# Patient Record
Sex: Male | Born: 1935 | Race: White | Hispanic: No | Smoking: Former smoker
Health system: Southern US, Community
[De-identification: ages and names within clinical notes are randomized; demographics above are authoritative.]

## PROBLEM LIST (undated history)

## (undated) DIAGNOSIS — C689 Malignant neoplasm of urinary organ, unspecified: Secondary | ICD-10-CM

## (undated) DIAGNOSIS — I6523 Occlusion and stenosis of bilateral carotid arteries: Secondary | ICD-10-CM

## (undated) DIAGNOSIS — I509 Heart failure, unspecified: Secondary | ICD-10-CM

## (undated) DIAGNOSIS — I350 Nonrheumatic aortic (valve) stenosis: Secondary | ICD-10-CM

## (undated) DIAGNOSIS — Z8546 Personal history of malignant neoplasm of prostate: Secondary | ICD-10-CM

## (undated) DIAGNOSIS — K5730 Diverticulosis of large intestine without perforation or abscess without bleeding: Secondary | ICD-10-CM

## (undated) DIAGNOSIS — Z86711 Personal history of pulmonary embolism: Secondary | ICD-10-CM

## (undated) DIAGNOSIS — J449 Chronic obstructive pulmonary disease, unspecified: Secondary | ICD-10-CM

## (undated) DIAGNOSIS — J849 Interstitial pulmonary disease, unspecified: Secondary | ICD-10-CM

## (undated) HISTORY — PX: TONSILLECTOMY: SHX5217

## (undated) HISTORY — DX: Heart failure, unspecified: I50.9

## (undated) HISTORY — DX: Personal history of pulmonary embolism: Z86.711

## (undated) HISTORY — DX: Nonrheumatic aortic (valve) stenosis: I35.0

## (undated) HISTORY — PX: INGUINAL HERNIA REPAIR: SHX194

## (undated) HISTORY — DX: Malignant neoplasm of urinary organ, unspecified: C68.9

## (undated) HISTORY — DX: Personal history of malignant neoplasm of prostate: Z85.46

## (undated) HISTORY — DX: Chronic obstructive pulmonary disease, unspecified: J44.9

## (undated) HISTORY — DX: Occlusion and stenosis of bilateral carotid arteries: I65.23

## (undated) HISTORY — DX: Diverticulosis of large intestine without perforation or abscess without bleeding: K57.30

## (undated) HISTORY — PX: PROSTATE SURGERY: SHX751

## (undated) HISTORY — PX: TOTAL HIP ARTHROPLASTY: SHX124

## (undated) HISTORY — DX: Interstitial pulmonary disease, unspecified: J84.9

---

## 2000-02-09 ENCOUNTER — Encounter: Admission: RE | Admit: 2000-02-09 | Discharge: 2000-05-09 | Payer: Self-pay | Admitting: Radiation Oncology

## 2000-05-25 ENCOUNTER — Encounter (INDEPENDENT_AMBULATORY_CARE_PROVIDER_SITE_OTHER): Payer: Self-pay | Admitting: Specialist

## 2000-05-25 ENCOUNTER — Ambulatory Visit (HOSPITAL_COMMUNITY): Admission: RE | Admit: 2000-05-25 | Discharge: 2000-05-25 | Payer: Self-pay | Admitting: Gastroenterology

## 2001-11-28 ENCOUNTER — Ambulatory Visit: Admission: RE | Admit: 2001-11-28 | Discharge: 2001-11-28 | Payer: Self-pay | Admitting: Family Medicine

## 2002-03-23 ENCOUNTER — Encounter: Payer: Self-pay | Admitting: Family Medicine

## 2002-03-23 ENCOUNTER — Ambulatory Visit (HOSPITAL_COMMUNITY): Admission: RE | Admit: 2002-03-23 | Discharge: 2002-03-23 | Payer: Self-pay | Admitting: Family Medicine

## 2003-05-08 ENCOUNTER — Ambulatory Visit (HOSPITAL_COMMUNITY): Admission: RE | Admit: 2003-05-08 | Discharge: 2003-05-08 | Payer: Self-pay | Admitting: Internal Medicine

## 2003-05-08 HISTORY — PX: COLONOSCOPY: SHX5424

## 2003-05-08 HISTORY — PX: ESOPHAGOGASTRODUODENOSCOPY: SHX1529

## 2004-03-29 ENCOUNTER — Ambulatory Visit (HOSPITAL_COMMUNITY): Admission: RE | Admit: 2004-03-29 | Discharge: 2004-03-29 | Payer: Self-pay | Admitting: Neurosurgery

## 2004-04-16 ENCOUNTER — Encounter: Payer: Self-pay | Admitting: Cardiology

## 2004-04-16 ENCOUNTER — Ambulatory Visit (HOSPITAL_COMMUNITY): Admission: RE | Admit: 2004-04-16 | Discharge: 2004-04-16 | Payer: Self-pay | Admitting: Cardiology

## 2004-04-21 ENCOUNTER — Inpatient Hospital Stay (HOSPITAL_BASED_OUTPATIENT_CLINIC_OR_DEPARTMENT_OTHER): Admission: RE | Admit: 2004-04-21 | Discharge: 2004-04-21 | Payer: Self-pay | Admitting: *Deleted

## 2004-04-27 ENCOUNTER — Inpatient Hospital Stay (HOSPITAL_COMMUNITY): Admission: RE | Admit: 2004-04-27 | Discharge: 2004-05-03 | Payer: Self-pay | Admitting: Cardiothoracic Surgery

## 2004-04-27 ENCOUNTER — Encounter (INDEPENDENT_AMBULATORY_CARE_PROVIDER_SITE_OTHER): Payer: Self-pay | Admitting: *Deleted

## 2004-06-29 ENCOUNTER — Ambulatory Visit: Payer: Self-pay | Admitting: Cardiology

## 2004-07-17 ENCOUNTER — Ambulatory Visit: Payer: Self-pay | Admitting: Internal Medicine

## 2004-08-13 ENCOUNTER — Inpatient Hospital Stay (HOSPITAL_COMMUNITY): Admission: RE | Admit: 2004-08-13 | Discharge: 2004-08-17 | Payer: Self-pay | Admitting: Neurosurgery

## 2004-08-17 ENCOUNTER — Ambulatory Visit: Payer: Self-pay | Admitting: Physical Medicine & Rehabilitation

## 2004-08-17 ENCOUNTER — Encounter: Payer: Self-pay | Admitting: Cardiovascular Disease

## 2004-08-17 ENCOUNTER — Ambulatory Visit: Payer: Self-pay | Admitting: Cardiovascular Disease

## 2004-08-17 ENCOUNTER — Inpatient Hospital Stay (HOSPITAL_COMMUNITY)
Admission: RE | Admit: 2004-08-17 | Discharge: 2004-08-27 | Payer: Self-pay | Admitting: Physical Medicine & Rehabilitation

## 2004-12-03 ENCOUNTER — Encounter: Admission: RE | Admit: 2004-12-03 | Discharge: 2005-01-18 | Payer: Self-pay | Admitting: Neurosurgery

## 2005-01-05 ENCOUNTER — Ambulatory Visit: Payer: Self-pay | Admitting: Cardiology

## 2005-01-26 ENCOUNTER — Inpatient Hospital Stay (HOSPITAL_COMMUNITY): Admission: RE | Admit: 2005-01-26 | Discharge: 2005-01-29 | Payer: Self-pay | Admitting: Orthopedic Surgery

## 2005-01-26 ENCOUNTER — Ambulatory Visit: Payer: Self-pay | Admitting: Physical Medicine & Rehabilitation

## 2005-11-29 ENCOUNTER — Ambulatory Visit: Payer: Self-pay | Admitting: Cardiology

## 2005-12-01 ENCOUNTER — Ambulatory Visit: Payer: Self-pay | Admitting: *Deleted

## 2005-12-06 ENCOUNTER — Ambulatory Visit: Payer: Self-pay | Admitting: Cardiology

## 2005-12-14 ENCOUNTER — Ambulatory Visit: Payer: Self-pay | Admitting: *Deleted

## 2005-12-20 ENCOUNTER — Ambulatory Visit: Payer: Self-pay | Admitting: Cardiology

## 2005-12-20 ENCOUNTER — Ambulatory Visit: Payer: Self-pay | Admitting: Internal Medicine

## 2005-12-24 ENCOUNTER — Ambulatory Visit: Payer: Self-pay | Admitting: Internal Medicine

## 2005-12-27 ENCOUNTER — Ambulatory Visit: Payer: Self-pay | Admitting: Internal Medicine

## 2005-12-30 ENCOUNTER — Ambulatory Visit: Payer: Self-pay

## 2005-12-30 ENCOUNTER — Ambulatory Visit: Payer: Self-pay | Admitting: Internal Medicine

## 2006-01-05 ENCOUNTER — Ambulatory Visit: Payer: Self-pay | Admitting: Cardiovascular Disease

## 2006-01-12 ENCOUNTER — Ambulatory Visit: Payer: Self-pay | Admitting: Cardiology

## 2006-01-13 ENCOUNTER — Ambulatory Visit: Payer: Self-pay | Admitting: Emergency Medicine

## 2006-01-24 ENCOUNTER — Ambulatory Visit: Payer: Self-pay | Admitting: Internal Medicine

## 2006-01-24 ENCOUNTER — Ambulatory Visit (HOSPITAL_COMMUNITY): Admission: RE | Admit: 2006-01-24 | Discharge: 2006-01-24 | Payer: Self-pay | Admitting: Internal Medicine

## 2006-01-31 ENCOUNTER — Ambulatory Visit: Payer: Self-pay | Admitting: Cardiology

## 2006-02-07 ENCOUNTER — Ambulatory Visit: Payer: Self-pay | Admitting: Internal Medicine

## 2006-02-18 ENCOUNTER — Ambulatory Visit: Payer: Self-pay | Admitting: Cardiology

## 2006-02-25 ENCOUNTER — Ambulatory Visit: Payer: Self-pay | Admitting: Internal Medicine

## 2006-04-14 ENCOUNTER — Ambulatory Visit (HOSPITAL_COMMUNITY): Admission: RE | Admit: 2006-04-14 | Discharge: 2006-04-14 | Payer: Self-pay | Admitting: Internal Medicine

## 2006-04-14 ENCOUNTER — Ambulatory Visit: Payer: Self-pay | Admitting: Internal Medicine

## 2006-05-10 ENCOUNTER — Ambulatory Visit: Payer: Self-pay | Admitting: Internal Medicine

## 2006-05-12 ENCOUNTER — Ambulatory Visit: Payer: Self-pay | Admitting: Internal Medicine

## 2006-05-18 ENCOUNTER — Ambulatory Visit: Payer: Self-pay | Admitting: Cardiology

## 2006-05-26 ENCOUNTER — Ambulatory Visit: Payer: Self-pay | Admitting: Internal Medicine

## 2006-06-28 ENCOUNTER — Ambulatory Visit: Payer: Self-pay | Admitting: Internal Medicine

## 2006-07-12 ENCOUNTER — Ambulatory Visit: Payer: Self-pay

## 2006-07-20 ENCOUNTER — Ambulatory Visit: Payer: Self-pay | Admitting: Internal Medicine

## 2006-10-03 ENCOUNTER — Ambulatory Visit: Payer: Self-pay | Admitting: Internal Medicine

## 2007-03-28 ENCOUNTER — Ambulatory Visit: Payer: Self-pay | Admitting: Internal Medicine

## 2007-03-31 ENCOUNTER — Ambulatory Visit: Payer: Self-pay | Admitting: Internal Medicine

## 2007-03-31 LAB — CONVERTED CEMR LAB
Basophils Relative: 0.6 % (ref 0.0–1.0)
CO2: 31 meq/L (ref 19–32)
Eosinophils Absolute: 0.1 10*3/uL (ref 0.0–0.6)
Eosinophils Relative: 2.3 % (ref 0.0–5.0)
GFR calc Af Amer: 82 mL/min
GFR calc non Af Amer: 68 mL/min
Glucose, Bld: 101 mg/dL — ABNORMAL HIGH (ref 70–99)
HDL: 23.1 mg/dL — ABNORMAL LOW (ref >39.0)
Hemoglobin: 14.8 g/dL (ref 13.0–17.0)
Lymphocytes Relative: 19.7 % (ref 12.0–46.0)
MCV: 93 fL (ref 78.0–100.0)
Monocytes Absolute: 0.6 10*3/uL (ref 0.2–0.7)
Neutro Abs: 3.6 10*3/uL (ref 1.4–7.7)
Potassium: 4 meq/L (ref 3.5–5.1)
Sodium: 143 meq/L (ref 135–145)
VLDL: 22 mg/dL (ref 0–40)
WBC: 5.3 10*3/uL (ref 4.5–10.5)

## 2007-05-18 ENCOUNTER — Ambulatory Visit: Payer: Self-pay | Admitting: Cardiology

## 2007-06-21 ENCOUNTER — Encounter: Payer: Self-pay | Admitting: Internal Medicine

## 2007-06-26 DIAGNOSIS — I359 Nonrheumatic aortic valve disorder, unspecified: Secondary | ICD-10-CM | POA: Insufficient documentation

## 2007-06-26 DIAGNOSIS — J449 Chronic obstructive pulmonary disease, unspecified: Secondary | ICD-10-CM | POA: Insufficient documentation

## 2007-06-26 DIAGNOSIS — K5730 Diverticulosis of large intestine without perforation or abscess without bleeding: Secondary | ICD-10-CM | POA: Insufficient documentation

## 2007-06-26 DIAGNOSIS — J4489 Other specified chronic obstructive pulmonary disease: Secondary | ICD-10-CM | POA: Insufficient documentation

## 2007-06-26 DIAGNOSIS — K449 Diaphragmatic hernia without obstruction or gangrene: Secondary | ICD-10-CM | POA: Insufficient documentation

## 2007-06-26 DIAGNOSIS — K2990 Gastroduodenitis, unspecified, without bleeding: Secondary | ICD-10-CM

## 2007-06-26 DIAGNOSIS — I509 Heart failure, unspecified: Secondary | ICD-10-CM | POA: Insufficient documentation

## 2007-06-26 DIAGNOSIS — K2970 Gastritis, unspecified, without bleeding: Secondary | ICD-10-CM | POA: Insufficient documentation

## 2007-06-26 DIAGNOSIS — G4733 Obstructive sleep apnea (adult) (pediatric): Secondary | ICD-10-CM | POA: Insufficient documentation

## 2007-06-26 DIAGNOSIS — Z8546 Personal history of malignant neoplasm of prostate: Secondary | ICD-10-CM

## 2007-06-26 DIAGNOSIS — IMO0002 Reserved for concepts with insufficient information to code with codable children: Secondary | ICD-10-CM | POA: Insufficient documentation

## 2007-06-26 DIAGNOSIS — Z86718 Personal history of other venous thrombosis and embolism: Secondary | ICD-10-CM

## 2007-07-28 HISTORY — PX: AORTIC VALVE REPLACEMENT: SHX41

## 2007-10-06 ENCOUNTER — Telehealth (INDEPENDENT_AMBULATORY_CARE_PROVIDER_SITE_OTHER): Payer: Self-pay | Admitting: *Deleted

## 2007-10-11 ENCOUNTER — Ambulatory Visit: Payer: Self-pay | Admitting: Internal Medicine

## 2007-10-11 LAB — CONVERTED CEMR LAB
ALT: 21 units/L (ref 0–53)
BUN: 21 mg/dL (ref 6–23)
CO2: 26 meq/L (ref 19–32)
GFR calc Af Amer: 74 mL/min
GFR calc non Af Amer: 61 mL/min
HDL: 23.1 mg/dL — ABNORMAL LOW (ref >39.0)
Hgb A1c MFr Bld: 5.8 % (ref 4.6–6.0)
Potassium: 4.2 meq/L (ref 3.5–5.1)
Total CHOL/HDL Ratio: 7
Triglycerides: 257 mg/dL (ref 0–149)
VLDL: 51 mg/dL — ABNORMAL HIGH (ref 0–40)

## 2007-10-17 ENCOUNTER — Ambulatory Visit: Payer: Self-pay | Admitting: Internal Medicine

## 2007-10-17 DIAGNOSIS — M25519 Pain in unspecified shoulder: Secondary | ICD-10-CM

## 2007-10-17 DIAGNOSIS — E785 Hyperlipidemia, unspecified: Secondary | ICD-10-CM

## 2007-10-17 DIAGNOSIS — E039 Hypothyroidism, unspecified: Secondary | ICD-10-CM | POA: Insufficient documentation

## 2007-11-13 ENCOUNTER — Ambulatory Visit: Payer: Self-pay | Admitting: Internal Medicine

## 2007-11-13 LAB — CONVERTED CEMR LAB
ALT: 19 units/L (ref 0–53)
Cholesterol: 94 mg/dL (ref 0–200)
LDL Cholesterol: 51 mg/dL (ref 0–99)
Total CHOL/HDL Ratio: 4.2
VLDL: 21 mg/dL (ref 0–40)

## 2007-12-13 ENCOUNTER — Encounter: Payer: Self-pay | Admitting: Internal Medicine

## 2007-12-14 ENCOUNTER — Encounter: Payer: Self-pay | Admitting: Internal Medicine

## 2007-12-18 ENCOUNTER — Ambulatory Visit: Payer: Self-pay | Admitting: Internal Medicine

## 2007-12-18 DIAGNOSIS — M549 Dorsalgia, unspecified: Secondary | ICD-10-CM | POA: Insufficient documentation

## 2007-12-18 DIAGNOSIS — R0602 Shortness of breath: Secondary | ICD-10-CM | POA: Insufficient documentation

## 2007-12-18 DIAGNOSIS — M79609 Pain in unspecified limb: Secondary | ICD-10-CM | POA: Insufficient documentation

## 2007-12-19 ENCOUNTER — Ambulatory Visit: Payer: Self-pay | Admitting: Cardiovascular Disease

## 2007-12-19 ENCOUNTER — Telehealth: Payer: Self-pay | Admitting: Internal Medicine

## 2007-12-20 ENCOUNTER — Telehealth: Payer: Self-pay | Admitting: Internal Medicine

## 2007-12-22 ENCOUNTER — Ambulatory Visit: Payer: Self-pay | Admitting: Cardiology

## 2007-12-22 LAB — CONVERTED CEMR LAB
Eosinophils Absolute: 0.1 10*3/uL (ref 0.0–0.7)
GFR calc Af Amer: 62 mL/min
GFR calc non Af Amer: 51 mL/min
HCT: 42.6 % (ref 39.0–52.0)
Monocytes Absolute: 0.7 10*3/uL (ref 0.1–1.0)
Monocytes Relative: 10.4 % (ref 3.0–12.0)
Platelets: 161 10*3/uL (ref 150–400)
Potassium: 4.4 meq/L (ref 3.5–5.1)
RDW: 13.5 % (ref 11.5–14.6)
Sodium: 142 meq/L (ref 135–145)

## 2007-12-26 ENCOUNTER — Ambulatory Visit: Payer: Self-pay | Admitting: Emergency Medicine

## 2007-12-26 DIAGNOSIS — R930 Abnormal findings on diagnostic imaging of skull and head, not elsewhere classified: Secondary | ICD-10-CM | POA: Insufficient documentation

## 2007-12-28 ENCOUNTER — Encounter: Payer: Self-pay | Admitting: Internal Medicine

## 2008-01-26 ENCOUNTER — Ambulatory Visit: Payer: Self-pay | Admitting: Emergency Medicine

## 2008-02-26 ENCOUNTER — Ambulatory Visit: Payer: Self-pay | Admitting: Cardiovascular Disease

## 2008-03-15 ENCOUNTER — Ambulatory Visit: Payer: Self-pay | Admitting: Emergency Medicine

## 2008-03-15 DIAGNOSIS — Z87891 Personal history of nicotine dependence: Secondary | ICD-10-CM | POA: Insufficient documentation

## 2008-04-19 ENCOUNTER — Encounter: Payer: Self-pay | Admitting: Internal Medicine

## 2008-05-08 ENCOUNTER — Telehealth: Payer: Self-pay | Admitting: Internal Medicine

## 2008-06-10 ENCOUNTER — Ambulatory Visit: Payer: Self-pay | Admitting: Internal Medicine

## 2008-06-10 ENCOUNTER — Ambulatory Visit: Payer: Self-pay | Admitting: Cardiology

## 2008-06-10 DIAGNOSIS — R300 Dysuria: Secondary | ICD-10-CM | POA: Insufficient documentation

## 2008-06-11 ENCOUNTER — Telehealth (INDEPENDENT_AMBULATORY_CARE_PROVIDER_SITE_OTHER): Payer: Self-pay | Admitting: *Deleted

## 2008-06-11 DIAGNOSIS — E875 Hyperkalemia: Secondary | ICD-10-CM | POA: Insufficient documentation

## 2008-06-12 ENCOUNTER — Ambulatory Visit: Payer: Self-pay | Admitting: Internal Medicine

## 2008-06-12 LAB — CONVERTED CEMR LAB
BUN: 29 mg/dL — ABNORMAL HIGH (ref 6–23)
CO2: 30 meq/L (ref 19–32)
Calcium: 8.7 mg/dL (ref 8.4–10.5)
Chloride: 106 meq/L (ref 96–112)
Creatinine, Ser: 1 mg/dL (ref 0.4–1.5)
Glucose, Bld: 123 mg/dL — ABNORMAL HIGH (ref 70–99)

## 2008-06-13 ENCOUNTER — Telehealth: Payer: Self-pay | Admitting: Internal Medicine

## 2008-09-11 ENCOUNTER — Ambulatory Visit: Payer: Self-pay | Admitting: Internal Medicine

## 2008-09-26 ENCOUNTER — Ambulatory Visit: Payer: Self-pay | Admitting: Internal Medicine

## 2008-09-26 LAB — CONVERTED CEMR LAB
ALT: 19 units/L (ref 0–53)
Cholesterol: 94 mg/dL (ref 0–200)
LDL Cholesterol: 50 mg/dL (ref 0–99)
Total CHOL/HDL Ratio: 4
VLDL: 21 mg/dL (ref 0–40)

## 2008-09-30 ENCOUNTER — Encounter: Payer: Self-pay | Admitting: Internal Medicine

## 2008-10-10 ENCOUNTER — Telehealth (INDEPENDENT_AMBULATORY_CARE_PROVIDER_SITE_OTHER): Payer: Self-pay | Admitting: *Deleted

## 2008-10-11 ENCOUNTER — Ambulatory Visit: Payer: Self-pay | Admitting: Internal Medicine

## 2008-10-11 DIAGNOSIS — R310 Gross hematuria: Secondary | ICD-10-CM

## 2008-10-11 LAB — CONVERTED CEMR LAB
Eosinophils Absolute: 0.1 10*3/uL (ref 0.0–0.7)
Eosinophils Relative: 2.2 % (ref 0.0–5.0)
Glucose, Urine, Semiquant: NEGATIVE
HCT: 42.3 % (ref 39.0–52.0)
Hemoglobin: 14.9 g/dL (ref 13.0–17.0)
MCV: 92.1 fL (ref 78.0–100.0)
Monocytes Absolute: 0.4 10*3/uL (ref 0.1–1.0)
Neutro Abs: 3.3 10*3/uL (ref 1.4–7.7)
Nitrite: NEGATIVE
Platelets: 141 10*3/uL — ABNORMAL LOW (ref 150–400)
RDW: 14.3 % (ref 11.5–14.6)
Specific Gravity, Urine: 1.015
WBC Urine, dipstick: NEGATIVE
WBC: 4.7 10*3/uL (ref 4.5–10.5)

## 2008-10-14 ENCOUNTER — Telehealth: Payer: Self-pay | Admitting: Internal Medicine

## 2008-10-14 ENCOUNTER — Encounter: Payer: Self-pay | Admitting: Internal Medicine

## 2008-10-22 ENCOUNTER — Telehealth: Payer: Self-pay | Admitting: Internal Medicine

## 2008-10-23 ENCOUNTER — Encounter: Payer: Self-pay | Admitting: Internal Medicine

## 2008-11-01 ENCOUNTER — Encounter (INDEPENDENT_AMBULATORY_CARE_PROVIDER_SITE_OTHER): Payer: Self-pay | Admitting: Urology

## 2008-11-01 ENCOUNTER — Ambulatory Visit (HOSPITAL_BASED_OUTPATIENT_CLINIC_OR_DEPARTMENT_OTHER): Admission: RE | Admit: 2008-11-01 | Discharge: 2008-11-01 | Payer: Self-pay | Admitting: Urology

## 2008-11-15 ENCOUNTER — Telehealth: Payer: Self-pay | Admitting: Internal Medicine

## 2008-12-16 ENCOUNTER — Telehealth: Payer: Self-pay | Admitting: Internal Medicine

## 2009-01-25 ENCOUNTER — Telehealth: Payer: Self-pay | Admitting: Family Medicine

## 2009-01-25 ENCOUNTER — Emergency Department (HOSPITAL_COMMUNITY): Admission: EM | Admit: 2009-01-25 | Discharge: 2009-01-25 | Payer: Self-pay | Admitting: Family Medicine

## 2009-05-06 ENCOUNTER — Ambulatory Visit: Payer: Self-pay | Admitting: Internal Medicine

## 2009-05-06 DIAGNOSIS — M542 Cervicalgia: Secondary | ICD-10-CM

## 2009-05-08 ENCOUNTER — Encounter: Payer: Self-pay | Admitting: Internal Medicine

## 2009-05-22 ENCOUNTER — Telehealth: Payer: Self-pay | Admitting: Internal Medicine

## 2009-05-23 ENCOUNTER — Ambulatory Visit: Payer: Self-pay | Admitting: Internal Medicine

## 2009-05-23 DIAGNOSIS — L259 Unspecified contact dermatitis, unspecified cause: Secondary | ICD-10-CM | POA: Insufficient documentation

## 2009-05-23 LAB — CONVERTED CEMR LAB
ALT: 24 units/L (ref 0–53)
AST: 22 units/L (ref 0–37)
Alkaline Phosphatase: 98 units/L (ref 39–117)
Bilirubin, Direct: 0.1 mg/dL (ref 0.0–0.3)
Calcium: 8.5 mg/dL (ref 8.4–10.5)
Cholesterol: 119 mg/dL (ref 0–200)
Creatinine, Ser: 1.22 mg/dL (ref 0.40–1.50)
Glucose, Bld: 124 mg/dL — ABNORMAL HIGH (ref 70–99)
Indirect Bilirubin: 0.7 mg/dL (ref 0.0–0.9)
Sodium: 143 meq/L (ref 135–145)
TSH: 4.556 microintl units/mL — ABNORMAL HIGH (ref 0.350–4.500)
Total CHOL/HDL Ratio: 4

## 2009-05-26 ENCOUNTER — Telehealth: Payer: Self-pay | Admitting: Internal Medicine

## 2009-06-02 ENCOUNTER — Ambulatory Visit: Payer: Self-pay | Admitting: Internal Medicine

## 2009-06-02 ENCOUNTER — Ambulatory Visit (HOSPITAL_BASED_OUTPATIENT_CLINIC_OR_DEPARTMENT_OTHER): Admission: RE | Admit: 2009-06-02 | Discharge: 2009-06-02 | Payer: Self-pay | Admitting: Internal Medicine

## 2009-06-02 ENCOUNTER — Ambulatory Visit: Payer: Self-pay | Admitting: Diagnostic Radiology

## 2009-06-02 DIAGNOSIS — K219 Gastro-esophageal reflux disease without esophagitis: Secondary | ICD-10-CM

## 2009-06-16 ENCOUNTER — Telehealth: Payer: Self-pay | Admitting: Internal Medicine

## 2009-06-18 ENCOUNTER — Ambulatory Visit: Payer: Self-pay | Admitting: Internal Medicine

## 2009-06-18 DIAGNOSIS — G4700 Insomnia, unspecified: Secondary | ICD-10-CM | POA: Insufficient documentation

## 2009-06-20 ENCOUNTER — Ambulatory Visit: Payer: Self-pay | Admitting: Cardiology

## 2009-06-20 DIAGNOSIS — I6529 Occlusion and stenosis of unspecified carotid artery: Secondary | ICD-10-CM

## 2009-07-04 ENCOUNTER — Encounter: Payer: Self-pay | Admitting: Internal Medicine

## 2009-07-04 ENCOUNTER — Ambulatory Visit: Payer: Self-pay | Admitting: Internal Medicine

## 2009-07-04 ENCOUNTER — Encounter: Payer: Self-pay | Admitting: Cardiology

## 2009-07-04 ENCOUNTER — Ambulatory Visit: Payer: Self-pay

## 2009-07-04 ENCOUNTER — Ambulatory Visit (HOSPITAL_COMMUNITY): Admission: RE | Admit: 2009-07-04 | Discharge: 2009-07-04 | Payer: Self-pay | Admitting: Internal Medicine

## 2009-07-07 ENCOUNTER — Encounter: Payer: Self-pay | Admitting: Internal Medicine

## 2009-07-09 ENCOUNTER — Ambulatory Visit: Payer: Self-pay | Admitting: Emergency Medicine

## 2009-07-14 ENCOUNTER — Encounter: Payer: Self-pay | Admitting: Internal Medicine

## 2009-08-07 ENCOUNTER — Ambulatory Visit: Payer: Self-pay | Admitting: Emergency Medicine

## 2009-08-07 DIAGNOSIS — J8410 Pulmonary fibrosis, unspecified: Secondary | ICD-10-CM

## 2009-08-12 ENCOUNTER — Ambulatory Visit: Payer: Self-pay | Admitting: Internal Medicine

## 2009-08-12 DIAGNOSIS — B370 Candidal stomatitis: Secondary | ICD-10-CM

## 2009-08-12 DIAGNOSIS — J018 Other acute sinusitis: Secondary | ICD-10-CM

## 2009-08-20 ENCOUNTER — Encounter: Payer: Self-pay | Admitting: Cardiology

## 2009-08-29 ENCOUNTER — Telehealth: Payer: Self-pay | Admitting: Internal Medicine

## 2009-09-01 ENCOUNTER — Telehealth: Payer: Self-pay | Admitting: Internal Medicine

## 2009-09-02 ENCOUNTER — Encounter (INDEPENDENT_AMBULATORY_CARE_PROVIDER_SITE_OTHER): Payer: Self-pay | Admitting: *Deleted

## 2009-09-15 ENCOUNTER — Encounter: Payer: Self-pay | Admitting: Internal Medicine

## 2009-09-18 ENCOUNTER — Ambulatory Visit: Payer: Self-pay | Admitting: Internal Medicine

## 2009-09-18 DIAGNOSIS — H109 Unspecified conjunctivitis: Secondary | ICD-10-CM | POA: Insufficient documentation

## 2009-09-18 LAB — CONVERTED CEMR LAB
BUN: 36 mg/dL — ABNORMAL HIGH (ref 6–23)
Creatinine, Ser: 1.18 mg/dL (ref 0.40–1.50)
Glucose, Bld: 107 mg/dL — ABNORMAL HIGH (ref 70–99)
HCT: 45.6 % (ref 39.0–52.0)
Hemoglobin: 14.9 g/dL (ref 13.0–17.0)
MCHC: 32.7 g/dL (ref 30.0–36.0)
MCV: 94 fL (ref 78.0–100.0)
RBC: 4.85 M/uL (ref 4.22–5.81)
RDW: 14.5 % (ref 11.5–15.5)
TSH: 3.264 microintl units/mL (ref 0.350–4.500)

## 2009-09-19 ENCOUNTER — Encounter: Payer: Self-pay | Admitting: Internal Medicine

## 2009-10-10 ENCOUNTER — Encounter: Payer: Self-pay | Admitting: Cardiology

## 2009-10-13 ENCOUNTER — Ambulatory Visit: Payer: Self-pay | Admitting: Cardiology

## 2009-10-27 ENCOUNTER — Telehealth: Payer: Self-pay | Admitting: Internal Medicine

## 2009-12-02 ENCOUNTER — Ambulatory Visit (HOSPITAL_COMMUNITY): Admission: RE | Admit: 2009-12-02 | Discharge: 2009-12-02 | Payer: Self-pay | Admitting: Urology

## 2009-12-11 ENCOUNTER — Telehealth: Payer: Self-pay | Admitting: Internal Medicine

## 2010-01-09 ENCOUNTER — Ambulatory Visit: Payer: Self-pay | Admitting: Internal Medicine

## 2010-01-30 ENCOUNTER — Encounter: Payer: Self-pay | Admitting: Cardiology

## 2010-01-30 ENCOUNTER — Ambulatory Visit (HOSPITAL_COMMUNITY): Admission: RE | Admit: 2010-01-30 | Discharge: 2010-01-30 | Payer: Self-pay | Admitting: Urology

## 2010-02-04 ENCOUNTER — Ambulatory Visit: Payer: Self-pay | Admitting: Cardiology

## 2010-02-04 DIAGNOSIS — R9431 Abnormal electrocardiogram [ECG] [EKG]: Secondary | ICD-10-CM

## 2010-02-10 ENCOUNTER — Ambulatory Visit (HOSPITAL_COMMUNITY): Admission: RE | Admit: 2010-02-10 | Discharge: 2010-02-10 | Payer: Self-pay | Admitting: Urology

## 2010-02-17 ENCOUNTER — Telehealth: Payer: Self-pay | Admitting: Internal Medicine

## 2010-02-17 ENCOUNTER — Encounter: Payer: Self-pay | Admitting: Cardiology

## 2010-02-23 ENCOUNTER — Ambulatory Visit: Payer: Self-pay | Admitting: Internal Medicine

## 2010-02-23 DIAGNOSIS — C679 Malignant neoplasm of bladder, unspecified: Secondary | ICD-10-CM | POA: Insufficient documentation

## 2010-02-23 DIAGNOSIS — K5909 Other constipation: Secondary | ICD-10-CM | POA: Insufficient documentation

## 2010-02-23 LAB — CONVERTED CEMR LAB
CO2: 23 meq/L (ref 19–32)
Calcium: 8.8 mg/dL (ref 8.4–10.5)
Chloride: 105 meq/L (ref 96–112)
Creatinine, Ser: 0.92 mg/dL (ref 0.40–1.50)
Platelets: 184 10*3/uL (ref 150–400)
RDW: 14.5 % (ref 11.5–15.5)
Sodium: 140 meq/L (ref 135–145)
WBC: 7.2 10*3/uL (ref 4.0–10.5)

## 2010-02-24 ENCOUNTER — Telehealth: Payer: Self-pay | Admitting: Internal Medicine

## 2010-02-26 ENCOUNTER — Encounter: Payer: Self-pay | Admitting: Cardiology

## 2010-03-18 ENCOUNTER — Inpatient Hospital Stay (HOSPITAL_COMMUNITY): Admission: RE | Admit: 2010-03-18 | Discharge: 2010-03-28 | Payer: Self-pay | Admitting: Urology

## 2010-03-18 ENCOUNTER — Ambulatory Visit: Payer: Self-pay | Admitting: Cardiovascular Disease

## 2010-03-18 ENCOUNTER — Encounter: Payer: Self-pay | Admitting: Urology

## 2010-03-18 HISTORY — PX: CYSTECTOMY: SHX5119

## 2010-03-18 HISTORY — PX: ROBOT ASSISTED LAPAROSCOPIC COMPLETE CYSTECT ILEAL CONDUIT: SHX5139

## 2010-04-02 ENCOUNTER — Encounter: Payer: Self-pay | Admitting: Internal Medicine

## 2010-04-02 ENCOUNTER — Encounter: Payer: Self-pay | Admitting: Cardiology

## 2010-04-20 ENCOUNTER — Encounter: Payer: Self-pay | Admitting: Internal Medicine

## 2010-04-22 ENCOUNTER — Encounter: Payer: Self-pay | Admitting: Internal Medicine

## 2010-04-22 LAB — CONVERTED CEMR LAB: TSH: 1.771 microintl units/mL (ref 0.350–4.500)

## 2010-04-28 ENCOUNTER — Encounter: Payer: Self-pay | Admitting: Cardiology

## 2010-04-28 ENCOUNTER — Encounter: Payer: Self-pay | Admitting: Internal Medicine

## 2010-04-30 ENCOUNTER — Ambulatory Visit: Payer: Self-pay | Admitting: Internal Medicine

## 2010-04-30 LAB — CONVERTED CEMR LAB
CO2: 23 meq/L (ref 19–32)
Calcium: 8.8 mg/dL (ref 8.4–10.5)
Chloride: 106 meq/L (ref 96–112)
Glucose, Bld: 104 mg/dL — ABNORMAL HIGH (ref 70–99)
HCT: 36.7 % — ABNORMAL LOW (ref 39.0–52.0)
MCV: 88 fL (ref 78.0–100.0)
Platelets: 208 10*3/uL (ref 150–400)
RBC: 4.17 M/uL — ABNORMAL LOW (ref 4.22–5.81)
Sodium: 141 meq/L (ref 135–145)
WBC: 7.5 10*3/uL (ref 4.0–10.5)

## 2010-05-01 ENCOUNTER — Encounter: Payer: Self-pay | Admitting: Internal Medicine

## 2010-06-30 ENCOUNTER — Encounter: Payer: Self-pay | Admitting: Internal Medicine

## 2010-06-30 ENCOUNTER — Ambulatory Visit (HOSPITAL_COMMUNITY)
Admission: RE | Admit: 2010-06-30 | Discharge: 2010-06-30 | Payer: Self-pay | Source: Home / Self Care | Admitting: Urology

## 2010-07-23 ENCOUNTER — Telehealth: Payer: Self-pay | Admitting: Internal Medicine

## 2010-07-27 ENCOUNTER — Ambulatory Visit: Payer: Self-pay | Admitting: Internal Medicine

## 2010-09-06 LAB — CONVERTED CEMR LAB
Calcium: 9.3 mg/dL (ref 8.4–10.5)
Creatinine, Ser: 1.1 mg/dL (ref 0.4–1.5)
Potassium: 5.7 meq/L — ABNORMAL HIGH (ref 3.5–5.1)
Sodium: 146 meq/L — ABNORMAL HIGH (ref 135–145)
TSH: 3.13 microintl units/mL (ref 0.35–5.50)

## 2010-09-10 NOTE — Progress Notes (Signed)
Summary: Handicap Form  Phone Note Call from Patient   Caller: Patient Call For: YOO  Summary of Call: HE DROPPED OFF A FORM TO RENEW HIS DISABILITY PLACARD AS HE GOT A TICKET.  HE NEEDS TO PICK THIS UP TODAY SO HE CAN GET THE FINE REDUCED.  [phone]  TO LET HIM KNOW WHEN READY  Initial call taken by: Roselle Locus,  July 23, 2010 9:24 AM  Follow-up for Phone Call        call was returned to patient at [phone], he was informed handicap form has been mailed to his home address.  Follow-up by: Glendell Docker CMA,  July 23, 2010 9:57 AM

## 2010-09-10 NOTE — Progress Notes (Signed)
Summary: Ambien Refill  Phone Note Refill Request Message from:  Fax from Pharmacy on October 27, 2009 8:51 AM  Refills Requested: Medication #1:  ZOLPIDEM TART ER 6.25 MG TAB   Dosage confirmed as above?Dosage Confirmed   Brand Name Necessary? No   Supply Requested: 1 month   Last Ref[facility]: [facility]2009 [facility] [address] [place] [phone] FAX [phone]   Method Requested: Electronic Next Appointment Scheduled: NONE Initial call taken by: Roselle Locus,  October 27, 2009 8:52 AM  Follow-up for Phone Call        ok to refill x 3  Left message on pharmacy voicemail ok to give #30 x 3 refills. Mervin Kung CMA  October 27, 2009 2:31 PM  Follow-up by: D. Thomos Lemons DO,  October 27, 2009 12:01 PM

## 2010-09-10 NOTE — Letter (Signed)
   [place] at [facility] [address] [place], [place]  [address]  [place] Phone: [phone]      September 19, 2009   [name] [address] [place], [place] [address]  RE:  LAB RESULTS  Dear  [name],  The following is an interpretation of your most recent lab tests.  Please take note of any instructions provided or changes to medications that have resulted from your lab work.  ELECTROLYTES:  Good - no changes needed  KIDNEY FUNCTION TESTS:  Stable - no changes needed     CBC:  Good - no changes needed       Sincerely Yours,    Dr. Thomos Lemons

## 2010-09-10 NOTE — Progress Notes (Signed)
Summary: Simvastatin Refill  Phone Note Refill Request Message from:  Fax from Pharmacy on Dec 11, 2009 5:25 PM  Refills Requested: Medication #1:  SIMVASTATIN 40 MG  TABS one by mouth qpm   Dosage confirmed as above?Dosage Confirmed   Brand Name Necessary? No   Supply Requested: 1 month   Last Refilled: 11/10/2009 Pt has scheduled follow up for 12/23/09 @ 3pm.  Mervin Kung CMA  Dec 12, 2009 9:13 AM    Method Requested: Electronic Next Appointment Scheduled: None Initial call taken by: Glendell Docker CMA,  Dec 11, 2009 5:25 PM    Prescriptions: SIMVASTATIN 40 MG  TABS (SIMVASTATIN) one by mouth qpm  #30 Tablet x 0   Entered by:   Mervin Kung CMA   Authorized by:   D. Thomos Lemons DO   Signed by:   Mervin Kung CMA on 12/12/2009   Method used:   Electronically to        [facility]  [phone]* (retail)       [address] [place], [place]  [address]       Ph: [phone] or [phone]       Fax: [phone]   RxID:   [id number]

## 2010-09-10 NOTE — Letter (Signed)
Summary: [facility] Office Note   [facility] Office Note   Imported By: Roderic Ovens 05/08/2010 11:20:52  _____________________________________________________________________  External Attachment:    Type:   Image     Comment:   External Document

## 2010-09-10 NOTE — Assessment & Plan Note (Signed)
Summary: rov / o.k. per dr. Theran Vandergrift/ gd  Medications Added SIMVASTATIN 40 MG  TABS (SIMVASTATIN) OUT ENABLEX 15 MG XR24H-TAB (DARIFENACIN HYDROBROMIDE) 1 by mouth dialy GINKOBA 40 MG TABS (GINKGO BILOBA) 1 by mouth daily VITAMIN B COMPLEX-C   CAPS (B COMPLEX-C) 1 by mouth daily BETA CAROTENE  CRYS (BULK CHEMICALS) 1 by mouth daily GINSENG 250 MG CAPS (GINSENG) 1 podaily SELENIUM 200 MCG CAPS (SELENIUM) 1 by mouth daily MELATONIN 3 MG TABS (MELATONIN) 1 by mouth daily * ALPHA LOPIC ACID 1 by mouth daily COQ-10 30 MG CAPS (COENZYME Q10) 1 by mouth daily OSCAL 500/200 D-3 500-200 MG-UNIT TABS (CALCIUM-VITAMIN D) 1 by mouth daily GLUCOSAMINE-CHONDROITIN  CAPS (GLUCOSAMINE-CHONDROIT-VIT C-MN) 1 by mouth daily ACIDOPHILUS  CAPS (LACTOBACILLUS) 1 by mouth daily ZINC 25 MG TABS (ZINC) 1 by mouth daily CLARITIN 10 MG TABS (LORATADINE) as needed      Allergies Added: NKDA   Visit Type:  Pre-op Evaluation Referring Provider:  Dr. Vonita Moss Primary Provider:  D. Thomos Lemons DO  CC:  Abnormal EKG.  History of Present Illness: I was asked to review an EKG. This was done prior to a planned cystoscope with biopsy. The EKG demonstrated sinus bradycardia with deep T-wave inversions. I compared this to previous and these T-wave inversions were new. Therefore, I brought the patient back for preoperative examination. I know him well from treatment of his aortic valve. He is a wonderful gentleman quite functional 77. In fact he is exercising routinely now. With this he denies any chest pressure, neck or arm discomfort. He does not get palpitations, presyncope or syncope. He does not have any PND or orthopnea. He will occasionally get some shortness of breath which he thinks may be related to chronic lung disease. However, he thinks this is improved compared with previous as well. Overall he feels quite well.  Current Medications (verified): 1)  Ambien Cr 6.25 Mg Cr-Tabs (Zolpidem Tartrate) .... One Tab By  Mouth At Bedtime As Needed 2)  Furosemide 40 Mg Tabs (Furosemide) .... Take 1 Tablet By Mouth Once A Day 3)  Levothyroxine Sodium 75 Mcg Tabs (Levothyroxine Sodium) .... Take 1 Tablet By Mouth Once A Day 4)  Caltrate 600 1500 Mg  Tabs (Calcium Carbonate) .... Take 1 Tablet By Mouth Two Times A Day 5)  Simvastatin 40 Mg  Tabs (Simvastatin) .... Out 6)  Nexium 40 Mg Cpdr (Esomeprazole Magnesium) .... One By Mouth Once Daily 30 Mins Before Am Meal 7)  Enablex 15 Mg Xr24h-Tab (Darifenacin Hydrobromide) .Marland Kitchen.. 1 By Mouth Dialy 8)  Ginkoba 40 Mg Tabs (Ginkgo Biloba) .Marland Kitchen.. 1 By Mouth Daily 9)  Vitamin B Complex-C   Caps (B Complex-C) .Marland Kitchen.. 1 By Mouth Daily 10)  Beta Carotene  Crys ([employer]) .Marland Kitchen.. 1 By Mouth Daily 11)  Ginseng 250 Mg Caps (Ginseng) .Marland Kitchen.. 1 Podaily 12)  Selenium 200 Mcg Caps (Selenium) .Marland Kitchen.. 1 By Mouth Daily 13)  Melatonin 3 Mg Tabs (Melatonin) .Marland Kitchen.. 1 By Mouth Daily 14)  Alpha Lopic Acid .Marland Kitchen.. 1 By Mouth Daily 15)  Coq-10 30 Mg Caps (Coenzyme Q10) .Marland Kitchen.. 1 By Mouth Daily 16)  Oscal 500/200 D-3 500-200 Mg-Unit Tabs (Calcium-Vitamin D) .Marland Kitchen.. 1 By Mouth Daily 17)  Glucosamine-Chondroitin  Caps (Glucosamine-Chondroit-Vit C-Mn) .Marland Kitchen.. 1 By Mouth Daily 18)  Acidophilus  Caps (Lactobacillus) .Marland Kitchen.. 1 By Mouth Daily 19)  Zinc 25 Mg Tabs (Zinc) .Marland Kitchen.. 1 By Mouth Daily 20)  Claritin 10 Mg Tabs (Loratadine) .... As Needed  Allergies (verified): No Known Drug  Allergies  Past History:  Past Medical History: Reviewed history from 09/18/2009 and no changes required. Prostate cancer, hx of (Dr. Vonita Moss S/P surgery and radiation) Congestive heart failure (preserved EF) COPD     Diverticulosis, colon  Pulmonary embolism, hx of  Aortic stenosis ILD   Carotid stenosis (30% bilateral)  Past Surgical History: Reviewed history from 09/18/2009 and no changes required. Aortic Valve Replacement (July 28, 2007 #21 Edwards  pericardial valve model 2700) Inguinal herniorrhaphy-left  Tonsillectomy      Transurethral resection of prostate  EGD- 05/08/2003 Colonoscopy-05/08/2003   L Hip replacement    Review of Systems       As stated in the HPI and negative for all other systems.   Vital Signs:  Patient profile:   77 year old male Height:      71 inches Weight:      198 pounds BMI:     27.72 Pulse rate:   58 / minute Resp:     18 per minute BP sitting:   147 / 88  (right arm)  Vitals Entered By: Marrion Coy, CNA (February 04, 2010 12:04 PM)  Physical Exam  General:  Well developed, well nourished, in no acute distress. Head:  normocephalic and atraumatic Eyes:  PERRLA/EOM intact; conjunctiva and lids normal. Mouth:  Teeth, gums and palate normal. Oral mucosa normal. Neck:  Neck supple, no JVD. No masses, thyromegaly or abnormal cervical nodes. Chest Wall:  well-healed sternotomy scar Lungs:  Clear bilaterally to auscultation and percussion. Abdomen:  Bowel sounds positive; abdomen soft and non-tender without masses, organomegaly, or hernias noted. No hepatosplenomegaly. Msk:  Back normal, normal gait. Muscle strength and tone normal for age Extremities:  No clubbing or cyanosis. Neurologic:  Alert and oriented x 3. Skin:  Intact without lesions or rashes. Cervical Nodes:  no significant adenopathy Psych:  Normal affect.   Detailed Cardiovascular Exam  Neck    Carotids: Carotids full and equal bilaterally without bruits.      Neck Veins: Normal, no JVD.    Heart    Inspection: no deformities or lifts noted.      Palpation: normal PMI with no thrills palpable.      Auscultation: S1 and S2 within normal limits, no S3, no S4, 2/6 apical systolic murmur nonradiating, no diastolic murmurs  Vascular    Abdominal Aorta: no palpable masses, pulsations, or audible bruits.      Femoral Pulses: normal femoral pulses bilaterally.      Pedal Pulses: pulses normal in all 4 extremities    Radial Pulses: normal radial pulses bilaterally.      Peripheral Circulation: no clubbing,  cyanosis, or edema noted with normal capillary refill.     EKG  Procedure date:  02/04/2010  Findings:      sinus bradycardia, alternating bundle branch block, T-wave inversions in the anterior leads consistent with most recent EKGs  Impression & Recommendations:  Problem # 1:  ELECTROCARDIOGRAM, ABNORMAL (ICD-794.31) The patient has an abnormal EKG as described. He certainly has some conduction abnormalities. I suspect this is the cause of the T-wave inversion. He has no symptoms consistent with ischemia. There is no suggestion that he is having any problems with his prosthetic valve. I reviewed his catheterization from 2005 and he had only minimal luminal irregularities on angiography. Therefore, there are no high risk findings which would suggest the need for further cardiovascular testing. He is at acceptable risk for the planned procedure. I would watch his heart rate and rhythm  closely as he is at risk for some bradycardia arrhythmias in particular. He may in the future need further management for symptomatic bradycardia arrhythmias but at present is completely asymptomatic.  Problem # 2:  ESSENTIAL HYPERTENSION, BENIGN (ICD-401.1) His blood pressure is very slightly elevated. This is unusual. He will continue the meds as listed and we will follow this.  Problem # 3:  CAROTID ARTERY DISEASE (ICD-433.10) He has mild nonobstructive plaque which we will follow as indicated.  Problem # 4:  AORTIC STENOSIS (ICD-424.1) No further cardiovascular testing is suggested. He is aware of the need for routine endocarditis prophylaxis.

## 2010-09-10 NOTE — Letter (Signed)
   [place] at [facility] [address] [place], [place]  [address]  [place] Phone: [phone]      May 01, 2010   [name] [address] [place], [place] [address]  RE:  LAB RESULTS  Dear  [name],  The following is an interpretation of your most recent lab tests.  Please take note of any instructions provided or changes to medications that have resulted from your lab work.  ELECTROLYTES:  Good - no changes needed  KIDNEY FUNCTION TESTS:  Good - no changes needed     CBC:  Stable - no changes needed       Sincerely Yours,    Dr. Thomos Lemons  Appended Document:  mailed

## 2010-09-10 NOTE — Assessment & Plan Note (Signed)
Summary: 2 month follow up/mhf   Vital Signs:  Patient profile:   77 year old male Weight:      203 pounds BMI:     28.42 O2 Sat:      95 % on Room air Temp:     97.9 degrees F Pulse rate:   47 / minute Pulse rhythm:   regular Resp:     20 per minute BP sitting:   136 / 70  (right arm) Cuff size:   large  Vitals Entered By: Glendell Docker CMA (February 23, 2010 9:57 AM)  O2 Flow:  Room air CC: Rm 3- 2 Month Follow up  Is Patient Diabetic? No Pain Assessment Patient in pain? no        Primary Care Provider:  DThomos Lemons DO  CC:  Rm 3- 2 Month Follow up .  History of Present Illness: 77 y/o white male with hx of Prostate cancer, hx of (Dr. Vonita Moss S/P surgery and radiation) Congestive heart failure (preserved EF),COPD ,Diverticulosis, colon ,Pulmonary embolism, hx of  Aortic stenosis ,ILD  for f/u  Int Hx - seen by urology - Dr. Vonita Moss cystoscopy reports abnormal CT of abd and pelvis - no metastatic cancer complete cystectomy recommended  pt complains of chronic constipation BM q 2-3 days stools are firm and hard to pass tried metamucil - no improvement stool softeners (generic miralax) - no improvement  pt exercises regularly  Preventive Screening-Counseling & Management  Alcohol-Tobacco     Smoking Status: quit  Allergies (verified): No Known Drug Allergies  Past History:  Past Medical History: Prostate cancer, hx of (Dr. Vonita Moss S/P surgery and radiation) Congestive heart failure (preserved EF) COPD     Diverticulosis, colon  Pulmonary embolism, hx of  Aortic stenosis  ILD   Carotid stenosis (30% bilateral)   Family History: Mother - history of lung cancer Father - history of prostate cancer pat grandfather-deceased from TB (pt lived with and was exposed to)  Was in the [employer] - sent to [facility]           Social History: Retired Pensions consultant - now Tree surgeon  Widowed - lives alone   4 living children   Alcohol use-yes (glass of wine per day)    Former Smoker q 15-20 yrs ago (80 pk year history)       Review of Systems  The patient denies weight loss, weight gain, chest pain, and prolonged cough.    Physical Exam  General:  alert, well-developed, and well-nourished.   Neck:  supple and no neck tenderness.   Lungs:  normal respiratory effort and normal breath sounds. faint fine crackles at bases.   Heart:  normal rate, regular rhythm, and no gallop.   Abdomen:  soft, non-tender, and normal bowel sounds.   Extremities:  trace left pedal edema and trace right pedal edema.   Neurologic:  cranial nerves II-XII intact and gait normal.   Psych:  normally interactive, good eye contact, not anxious appearing, and not depressed appearing.     Impression & Recommendations:  Problem # 1:  CONSTIPATION, CHRONIC (ICD-564.09) BM q 2-3 days stools are firm and hard to pass tried metamucil - no improvement stool softeners (generic miralax) - no improvement  trial of lactulose  Orders: T-Basic Metabolic Panel [phone]) T-TSH [phone]) T-CBC No Diff ([phone])  His updated medication list for this problem includes:    Lactulose 10 Gm/54ml Soln (Lactulose) .Marland KitchenMarland KitchenMarland KitchenMarland Kitchen 10 gm by mouth once daily as needed for constipation  Problem # 2:  HYPOTHYROIDISM (ICD-244.9) monitor TFTs His updated medication list for this problem includes:    Levothyroxine Sodium 100 Mcg Tabs (Levothyroxine sodium) ..... One by mouth once daily  Labs Reviewed: TSH: 3.264 (09/18/2009)    HgBA1c: 5.8 (10/11/2007) Chol: 119 (05/23/2009)   HDL: 30 (05/23/2009)   LDL: 55 (05/23/2009)   TG: 172 (05/23/2009)  Problem # 3:  BLADDER CANCER (ICD-188.9) cystectomy planned in Aug.  he will need cardiac clearance  Problem # 4:  HYPERLIPIDEMIA (ICD-272.4) reduce simvasatin dose His updated medication list for this problem includes:    Simvastatin 10 Mg Tabs (Simvastatin) ..... One by mouth once daily  Labs Reviewed: SGOT: 22 (05/23/2009)   SGPT: 24  (05/23/2009)   HDL:30 (05/23/2009), 23.5 (09/26/2008)  LDL:55 (05/23/2009), 50 (09/26/2008)  Chol:119 (05/23/2009), 94 (09/26/2008)  Trig:172 (05/23/2009), 103 (09/26/2008)  Complete Medication List: 1)  Ambien Cr 6.25 Mg Cr-tabs (Zolpidem tartrate) .... One tab by mouth at bedtime as needed 2)  Furosemide 40 Mg Tabs (Furosemide) .... Take 1 tablet by mouth once a day 3)  Levothyroxine Sodium 100 Mcg Tabs (Levothyroxine sodium) .... One by mouth once daily 4)  Caltrate 600 1500 Mg Tabs (Calcium carbonate) .... Take 1 tablet by mouth two times a day 5)  Simvastatin 10 Mg Tabs (Simvastatin) .... One by mouth once daily 6)  Nexium 40 Mg Cpdr (Esomeprazole magnesium) .... One by mouth once daily 30 mins before am meal 7)  Enablex 15 Mg Xr24h-tab (Darifenacin hydrobromide) .Marland Kitchen.. 1 by mouth dialy 8)  Ginkoba 40 Mg Tabs (Ginkgo biloba) .Marland Kitchen.. 1 by mouth daily 9)  Vitamin B Complex-c Caps (B complex-c) .Marland Kitchen.. 1 by mouth daily 10)  Beta Carotene Crys (Bulk chemicals) .Marland Kitchen.. 1 by mouth daily 11)  Ginseng 250 Mg Caps (Ginseng) .Marland Kitchen.. 1 podaily 12)  Selenium 200 Mcg Caps (Selenium) .Marland Kitchen.. 1 by mouth daily 13)  Melatonin 3 Mg Tabs (Melatonin) .Marland Kitchen.. 1 by mouth daily 14)  Alpha Lopic Acid  .Marland Kitchen.. 1 by mouth daily 15)  Coq-10 30 Mg Caps (Coenzyme q10) .Marland Kitchen.. 1 by mouth daily 16)  Oscal 500/200 D-3 500-200 Mg-unit Tabs (Calcium-vitamin d) .Marland Kitchen.. 1 by mouth daily 17)  Glucosamine-chondroitin Caps (Glucosamine-chondroit-vit c-mn) .Marland Kitchen.. 1 by mouth daily 18)  Acidophilus Caps (Lactobacillus) .Marland Kitchen.. 1 by mouth daily 19)  Zinc 25 Mg Tabs (Zinc) .Marland Kitchen.. 1 by mouth daily 20)  Claritin 10 Mg Tabs (Loratadine) .... As needed 21)  Lactulose 10 Gm/44ml Soln (Lactulose) .Marland Kitchen.. 10 gm by mouth once daily as needed for constipation  Patient Instructions: 1)  Please schedule a follow-up appointment in 2 months. Prescriptions: LACTULOSE 10 GM/15ML SOLN (LACTULOSE) 10 gm by mouth once daily as needed for constipation  #1 month x 2   Entered and  Authorized by:   D. Thomos Lemons DO   Signed by:   D. Thomos Lemons DO on 02/23/2010   Method used:   [facility] to        [facility]  [phone][facility] (retail)       [address] [place], [place]  [address]       Ph: [phone] or [phone]       Fax: [phone]   RxID:   [id number] SIMVASTATIN 10 MG TABS (SIMVASTATIN) one by mouth once daily  #30 x 3   Entered and Authorized by:   D. Thomos Lemons DO   Signed by:   D. Thomos Lemons DO on 02/23/2010   Method used:  Electronically to        [facility]  [phone]* (retail)       [address] [place], [place]  [address]       Ph: [phone] or [phone]       Fax: [phone]   RxID:   [id number]   Current Allergies (reviewed today): No known allergies

## 2010-09-10 NOTE — Miscellaneous (Signed)
  Clinical Lists Changes  Observations: Added new observation of US CAROTID: Mild carotid disease bilaterally 0-39% bilateral ICA stenosis Chronic occlusion of the RECA  f/u 1 year (07/04/2009 12:25)      Carotid Doppler  Procedure date:  07/04/2009  Findings:      Mild carotid disease bilaterally 0-39% bilateral ICA stenosis Chronic occlusion of the RECA  f/u 1 year

## 2010-09-10 NOTE — Letter (Signed)
Summary: [facility]  [facility]   Imported By: Lanelle Bal 05/07/2010 13:05:57  _____________________________________________________________________  External Attachment:    Type:   Image     Comment:   External Document

## 2010-09-10 NOTE — Miscellaneous (Signed)
  Clinical Lists Changes  Observations: Added new observation of US CAROTID: MIld carotid artery disease, bilaterally 0-39% bilateral ICA stenosis Chronic occlusion of the RECA  f/u 1 year (07/04/2009 10:32)      Carotid Doppler  Procedure date:  07/04/2009  Findings:      MIld carotid artery disease, bilaterally 0-39% bilateral ICA stenosis Chronic occlusion of the RECA  f/u 1 year

## 2010-09-10 NOTE — Assessment & Plan Note (Signed)
Summary: rov per pt call/lg      Allergies Added: NKDA  Visit Type:  Follow-up Primary Provider:  Dondra Spry DO  CC:  Aortic Stenosis.  History of Present Illness: The patient presents for followup. When I last saw him he was having some increased dyspnea at night. He saw Dr. Delton Coombes for evaluation of this and was switched to ProAir.  However, he never started that medication. No oxygen desaturation was identified and he was not given oxygen area and he said he thinks these episodes of shortness of breath at night were related to anxiety. He is not having this problem currently. He denies any PND or orthopnea. He is not having no palpitations, presyncope or syncope. He's had no chest discomfort. He exercises routinely. He's had no weight gain or swelling.  Current Medications (verified): 1)  Ambien Cr 6.25 Mg Cr-Tabs (Zolpidem Tartrate) .... One Tab By Mouth At Bedtime As Needed 2)  Furosemide 40 Mg Tabs (Furosemide) .... Take 1 Tablet By Mouth Once A Day 3)  Levothyroxine Sodium 75 Mcg Tabs (Levothyroxine Sodium) .... Take 1 Tablet By Mouth Once A Day 4)  Caltrate 600 1500 Mg  Tabs (Calcium Carbonate) .... Take 1 Tablet By Mouth Two Times A Day 5)  Simvastatin 40 Mg  Tabs (Simvastatin) .... One By Mouth Qpm 6)  Voltaren 1 %  Gel (Diclofenac Sodium) .... Apply Three Times A Day - Qid 7)  Triamcinolone Acetonide 0.1 % Crea (Triamcinolone Acetonide) .... Apply Two Times A Day X 1 Week 8)  Nexium 40 Mg Cpdr (Esomeprazole Magnesium) .... One By Mouth Once Daily 30 Mins Before Am Meal  Allergies (verified): No Known Drug Allergies  Past History:  Past Medical History: Reviewed history from 09/18/2009 and no changes required. Prostate cancer, hx of (Dr. Vonita Moss S/P surgery and radiation) Congestive heart failure (preserved EF) COPD     Diverticulosis, colon  Pulmonary embolism, hx of  Aortic stenosis ILD   Carotid stenosis (30% bilateral)  Past Surgical History: Reviewed history  from 09/18/2009 and no changes required. Aortic Valve Replacement (July 28, 2007 #21 Edwards  pericardial valve model 2700) Inguinal herniorrhaphy-left  Tonsillectomy    Transurethral resection of prostate  EGD- 05/08/2003 Colonoscopy-05/08/2003   L Hip replacement    Review of Systems       Neck pain, reflux, difficulty breathing, hematuria (followed by urology).  Otherwise as sated in the HPI and negative for all other systems.  Vital Signs:  Patient profile:   77 year old male Height:      71 inches Weight:      200 pounds Pulse rate:   50 / minute Resp:     16 per minute BP sitting:   140 / 62  (right arm)  Vitals Entered By: Marrion Coy, CNA (October 13, 2009 11:22 AM)  Physical Exam  General:  Well developed, well nourished, in no acute distress. Head:  normocephalic and atraumatic Eyes:  PERRLA/EOM intact; conjunctiva and lids normal. Mouth:  Teeth, gums and palate normal. Oral mucosa normal. Neck:  Neck supple, no JVD. No masses, thyromegaly or abnormal cervical nodes. Chest Wall:  well-healed sternotomy scar Lungs:  Clear bilaterally to auscultation and percussion. Heart:  S1 and S2 within normal limits, no S3, no S4, no clicks, no rubs, no murmurs. Abdomen:  Bowel sounds positive; abdomen soft and non-tender without masses, organomegaly, or hernias noted. No hepatosplenomegaly. Msk:  Back normal, normal gait. Muscle strength and tone normal for age Pulses:  pulses normal in all 4 extremities Extremities:  No clubbing or cyanosis. Neurologic:  Alert and oriented x 3. Skin:  Intact without lesions or rashes. Psych:  Normal affect.   EKG  Procedure date:  10/13/2009  Findings:      sinus bradycardia, rate 50, axis within normal limits, intervals within normal limits, no acute ST-T wave changes.  Impression & Recommendations:  Problem # 1:  AORTIC STENOSIS (ICD-424.1) He is having no symptoms related to this. No further evaluation is planned. He will  continue with his low dose of diuretic. Orders: EKG w/ Interpretation (93000)  Problem # 2:  CAROTID ARTERY DISEASE (ICD-433.10) This demonstrates chronic obstruction as well as mild nonobstructive disease as described elsewhere. He will have followup in November.  Problem # 3:  ESSENTIAL HYPERTENSION, BENIGN (ICD-401.1) His blood pressure is acceptable upper limits. He will continue to follow this and no change in therapy is suggested.  Patient Instructions: 1)  Your physician recommends that you schedule a follow-up appointment in: 12 months with Dr Antoine Poche 2)  Your physician recommends that you continue on your current medications as directed. Please refer to the Current Medication list given to you today.

## 2010-09-10 NOTE — Letter (Signed)
Summary: [facility] Office Note   [facility] Office Note   Imported By: Roderic Ovens 02/27/2010 10:54:06  _____________________________________________________________________  External Attachment:    Type:   Image     Comment:   External Document

## 2010-09-10 NOTE — Letter (Signed)
Summary: [facility]  [facility]   Imported By: Maryln Gottron 07/13/2010 11:13:47  _____________________________________________________________________  External Attachment:    Type:   Image     Comment:   External Document

## 2010-09-10 NOTE — Letter (Signed)
Summary: [facility]  [facility]   Imported By: Lanelle Bal 04/10/2010 10:33:45  _____________________________________________________________________  External Attachment:    Type:   Image     Comment:   External Document

## 2010-09-10 NOTE — Miscellaneous (Signed)
Summary: Orders Update  Clinical Lists Changes  Orders: Added new Test order of T-TSH ([id number]) - Signed 

## 2010-09-10 NOTE — Progress Notes (Signed)
Summary: Levothyroxine Refill  Phone Note Refill Request Message from:  Fax from Pharmacy on September 01, 2009 8:55 AM  Refills Requested: Medication #1:  LEVOTHYROXINE SODIUM 75 MCG TABS Take 1 tablet by mouth once a day   Dosage confirmed as above?Dosage Confirmed   Brand Name Necessary? No   Supply Requested: 1 month   Last Refilled: 04/21/2009  Method Requested: Electronic Next Appointment Scheduled: 09-18-09 Dr Artist Pais  Initial call taken by: Roselle Locus,  September 01, 2009 8:56 AM  Follow-up for Phone Call        Rx completed in Dr. Tiajuana Amass Follow-up by: Glendell Docker CMA,  September 01, 2009 4:42 PM    Prescriptions: LEVOTHYROXINE SODIUM 75 MCG TABS (LEVOTHYROXINE SODIUM) Take 1 tablet by mouth once a day  #30 x 5   Entered by:   Glendell Docker CMA   Authorized by:   D. Thomos Lemons DO   Signed by:   Glendell Docker CMA on 09/01/2009   Method used:   Electronically to        [facility]  [phone]* (retail)       [address] [place], [place]  [address]       Ph: [phone] or [phone]       Fax: [phone]   RxID:   [id number]

## 2010-09-10 NOTE — Letter (Signed)
Summary: [facility] Office Visit Note   [facility] Office Visit Note   Imported By: Roderic Ovens 04/22/2010 15:07:23  _____________________________________________________________________  External Attachment:    Type:   Image     Comment:   External Document

## 2010-09-10 NOTE — Progress Notes (Signed)
Summary: Ambien Refill  Phone Note Refill Request Message from:  Fax from Pharmacy on February 17, 2010 8:04 AM  Refills Requested: Medication #1:  AMBIEN CR 6.25 MG CR-TABS one tab by mouth at bedtime as needed   Dosage confirmed as above?Dosage Confirmed   Brand Name Necessary? No   Supply Requested: 1 month   Last Refilled: 01/20/2010  Method Requested: Telephone to Pharmacy Next Appointment Scheduled: No future appointments on file Initial call taken by: Glendell Docker CMA,  February 17, 2010 8:04 AM  Follow-up for Phone Call        ok to refill x 3 Follow-up by: D. Thomos Lemons DO,  February 17, 2010 6:19 PM  Additional Follow-up for Phone Call Additional follow up Details #1::        Rx called to pharmacy Additional Follow-up by: Glendell Docker CMA,  February 18, 2010 9:13 AM    Prescriptions: AMBIEN CR 6.25 MG CR-TABS (ZOLPIDEM TARTRATE) one tab by mouth at bedtime as needed  #30 x 3   Entered by:   Glendell Docker CMA   Authorized by:   D. Thomos Lemons DO   Signed by:   Glendell Docker CMA on 02/18/2010   Method used:   Telephoned to ...       [facility]  [phone]* (retail)       [address] [place], [place]  [address]       Ph: [phone] or [phone]       Fax: [phone]   RxID:   [id number]

## 2010-09-10 NOTE — Assessment & Plan Note (Signed)
Summary: 2 MONTH FOLLOW UP/MHF   Vital Signs:  Patient profile:   77 year old male Height:      71 inches Weight:      202.75 pounds O2 Sat:      99 % on Room air Temp:     97.3 degrees F oral Pulse rate:   61 / minute BP sitting:   130 / 84  (right arm)  Vitals Entered By: Lucious Groves (September 18, 2009 9:28 AM)  O2 Flow:  Room air CC: 2 mo f/u--Has been dizzy, but denies fatigue, and nausea and vomiting. Voltaren refill request/kb, Red eye Is Patient Diabetic? No Pain Assessment Patient in pain? no        Primary Care Provider:  Dondra Spry DO  CC:  2 mo f/u--Has been dizzy, but denies fatigue, and nausea and vomiting. Voltaren refill request/kb, and Red eye.  History of Present Illness: 77 y/o white male for routine f/u. pt has chronic insomnia.  he takes first [place] it early AM but freq get up 2-3 am and takes second [place].   no complains of dizziness and unsteadiness  Red Eye      This is an 77 year old man who presents with Red eye.  The patient reports redness of the left eye, but denies pain, loss of vision, and discharge.  Precipitating events include no prior incident.    Current Medications (verified): 1)  Ambien Cr 6.25 Mg Cr-Tabs (Zolpidem Tartrate) .... One or Two Tabs By Mouth Qd 2)  Furosemide 40 Mg Tabs (Furosemide) .... Take 1 Tablet By Mouth Once A Day 3)  Levothyroxine Sodium 75 Mcg Tabs (Levothyroxine Sodium) .... Take 1 Tablet By Mouth Once A Day 4)  Caltrate 600 1500 Mg  Tabs (Calcium Carbonate) .... Take 1 Tablet By Mouth Two Times A Day 5)  Simvastatin 40 Mg  Tabs (Simvastatin) .... One By Mouth Qpm 6)  Voltaren 1 %  Gel (Diclofenac Sodium) .... Apply Three Times A Day - Qid 7)  Triamcinolone Acetonide 0.1 % Crea (Triamcinolone Acetonide) .... Apply Two Times A Day X 1 Week 8)  Nexium 40 Mg Cpdr (Esomeprazole Magnesium) .... One By Mouth Once Daily 30 Mins Before Am Meal 9)  Cefuroxime Axetil 500 Mg Tabs (Cefuroxime Axetil) .... One By Mouth  Two Times A Day 10)  Ferrous Sulfate 325 (65 Fe) Mg Tabs (Ferrous Sulfate) .Marland Kitchen.. 1 Tab With Meals  Allergies (verified): No Known Drug Allergies  Past History:  Past Medical History: Prostate cancer, hx of (Dr. Vonita Moss S/P surgery and radiation) Congestive heart failure (preserved EF) COPD     Diverticulosis, colon  Pulmonary embolism, hx of  Aortic stenosis ILD   Carotid stenosis (30% bilateral)  Past Surgical History: Aortic Valve Replacement (July 28, 2007 #21 Edwards  pericardial valve model 2700) Inguinal herniorrhaphy-left  Tonsillectomy    Transurethral resection of prostate  EGD- 05/08/2003 Colonoscopy-05/08/2003   L Hip replacement    Family History: Mother - history of lung cancer Father - history of prostate cancer pat grandfather-deceased from TB (pt lived with and was exposed to)  Was in the [employer] - sent to [facility]          Social History: Retired Pensions consultant - now Tree surgeon  Widowed - lives alone  4 living children   Alcohol use-yes (glass of wine per day)  Former Smoker q 15-20 yrs ago (80 pk year history)       Physical Exam  General:  alert, well-developed,  and well-nourished.   Head:  normocephalic and atraumatic.   Eyes:  left conjunctival injection,  pupils equal, pupils round, and pupils reactive to light.   Ears:  R ear normal and L ear normal.   Lungs:  normal respiratory effort and normal breath sounds. faint fine crackles at bases.   Heart:  normal rate, regular rhythm, and no gallop.   Neurologic:  ambulates with cane   Impression & Recommendations:  Problem # 1:  CONJUNCTIVITIS (ICD-372.30) left eye conjunctivitis.  no pain.  no change in vision.  use eye gtts as directed.  Patient advised to call office if symptoms persist or worsen.  Problem # 2:  INSOMNIA, CHRONIC (ICD-307.42)  Pt advised to decrease ambien cr dose.  we discussed increased risk for fall and confusion.  Complete Medication List: 1)  Ambien Cr 6.25 Mg Cr-tabs  (Zolpidem tartrate) .... One tab by mouth at bedtime as needed 2)  Furosemide 40 Mg Tabs (Furosemide) .... Take 1 tablet by mouth once a day 3)  Levothyroxine Sodium 75 Mcg Tabs (Levothyroxine sodium) .... Take 1 tablet by mouth once a day 4)  Caltrate 600 1500 Mg Tabs (Calcium carbonate) .... Take 1 tablet by mouth two times a day 5)  Simvastatin 40 Mg Tabs (Simvastatin) .... One by mouth qpm 6)  Voltaren 1 % Gel (Diclofenac sodium) .... Apply three times a day - qid 7)  Triamcinolone Acetonide 0.1 % Crea (Triamcinolone acetonide) .... Apply two times a day x 1 week 8)  Nexium 40 Mg Cpdr (Esomeprazole magnesium) .... One by mouth once daily 30 mins before am meal 9)  Cefuroxime Axetil 500 Mg Tabs (Cefuroxime axetil) .... One by mouth two times a day 10)  Ferrous Sulfate 325 (65 Fe) Mg Tabs (Ferrous sulfate) .Marland Kitchen.. 1 tab with meals 11)  Neomycin-polymyxin-dexameth 3.5-10000-0.1 Susp (Neomycin-polymyxin-dexameth) .... 2 gtts to left eye 4 x per day  Other Orders: TLB-TSH (Thyroid Stimulating Hormone) (84443-TSH) T-Basic Metabolic Panel ([id number]) T-CBC No Diff ([id number])  Patient Instructions: 1)  Only take one ambien at bedtime as needed 2)  Please schedule a follow-up appointment in 2 months. Prescriptions: AMBIEN CR 6.25 MG CR-TABS (ZOLPIDEM TARTRATE) one tab by mouth at bedtime as needed  #30 x 2   Entered and Authorized by:   D. Thomos Lemons DO   Signed by:   D. Thomos Lemons DO on 09/18/2009   Method used:   Print then Give to Patient   RxID:   [id number] NEOMYCIN-POLYMYXIN-DEXAMETH 3.5-10000-0.1 SUSP [facility]) 2 gtts to left eye 4 x per day  #1 week x 0   Entered and Authorized by:   D. Thomos Lemons DO   Signed by:   D. Thomos Lemons DO on 09/18/2009   Method used:   Electronically to        [facility]  [phone]* (retail)       [address] [place], [place]  [address]       Ph: [phone] or [phone]       Fax: [phone]   RxID:    [id number]

## 2010-09-10 NOTE — Progress Notes (Signed)
Summary: Lab Results  Phone Note Outgoing Call   Summary of Call: call pt - blood tests shows pt needs higher dose of thyroid medication.  see new rx.  schedule repeat TSH in 2 months  244.90 Initial call taken by: D. Thomos Lemons DO,  February 24, 2010 1:28 PM  Follow-up for Phone Call        patient advised per Dr Artist Pais instructions. He states he will be scheduling major surgery and will  follow up after surgery recovery. At this time he states he is not sure where he will have the blood work done. Patient was advised that I would call him back in 2 months  Follow-up by: Glendell Docker CMA,  February 24, 2010 4:26 PM    New/Updated Medications: LEVOTHYROXINE SODIUM 100 MCG TABS (LEVOTHYROXINE SODIUM) one by mouth once daily Prescriptions: LEVOTHYROXINE SODIUM 100 MCG TABS (LEVOTHYROXINE SODIUM) one by mouth once daily  #30 x 2   Entered and Authorized by:   D. Thomos Lemons DO   Signed by:   D. Thomos Lemons DO on 02/24/2010   Method used:   Electronically to        [facility]  [phone]* (retail)       [address] [place], [place]  [address]       Ph: [phone] or [phone]       Fax: [phone]   RxID:   [id number]

## 2010-09-10 NOTE — Letter (Signed)
Summary: Appointment - Missed  [facility]     [place], [place]    Phone:   Fax:      September 02, 2009 MRN: [id number]   [name] [address] [place], [place]  [address]   Dear Mr. SCHELLHASE,  Our records indicate you missed your appointment on  08-29-2009 with  Dr. Antoine Poche   It is very important that we reach you to reschedule this appointment. We look forward to participating in your health care needs. Please contact us at the number listed above at your earliest convenience to reschedule this appointment.     Sincerely,   Lorne Skeens  [facility] Scheduling Team

## 2010-09-10 NOTE — Assessment & Plan Note (Signed)
Summary: SORE THROAT CONGESTION/MHF   Vital Signs:  Patient profile:   77 year old male Weight:      197.50 pounds O2 Sat:      99 % on Room air Temp:     97.5 degrees F oral Pulse rate:   55 / minute Pulse rhythm:   regular Resp:     22 per minute BP sitting:   130 / 80  (right arm) Cuff size:   large  Vitals Entered By: Glendell Docker CMA (August 12, 2009 9:34 AM)  O2 Flow:  Room air  Primary Care Provider:  D. Thomos Lemons DO  CC:  URI symptoms.  History of Present Illness:  URI Symptoms      This is an 77 year old man who presents with URI symptoms.  The patient reports nasal congestion, purulent nasal discharge, sore throat, and sick contacts.  The patient denies fever, stiff neck, dyspnea, wheezing, vomiting, and diarrhea.  The patient also reportsfatigue and hoarseness.   Symptoms present for the past 5 days. Attempted over the counter cough syrup with some relief   Preventive Screening-Counseling & Management  Alcohol-Tobacco     Smoking Status: quit  Allergies (verified): No Known Drug Allergies  Past History:  Past Medical History: Prostate cancer, hx of (Dr. Vonita Moss S/P surgery and radiation) Congestive heart failure (preserved EF) COPD    Diverticulosis, colon  Pulmonary embolism, hx of  Aortic stenosis ILD   Carotid stenosis (30% bilateral)  Past Surgical History: Aortic Valve Replacement (July 28, 2007 #21 Edwards  pericardial valve model 2700) Inguinal herniorrhaphy-left  Tonsillectomy    Transurethral resection of prostate  EGD- 05/08/2003 Colonoscopy-05/08/2003  L Hip replacement    Family History: Mother - history of lung cancer Father - history of prostate cancer pat grandfather-deceased from TB (pt lived with and was exposed to)  Was in the [employer] - sent to [facility]         Social History: 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   Eyes:  mild left conjunctival injection Mouth:  pharyngeal erythema and white plaque(s).   Neck:  supple and no neck tenderness.   Lungs:  normal respiratory effort and normal breath sounds. faint fine crackles at bases.   Heart:  normal rate, regular rhythm, and no gallop.     Impression & Recommendations:  Problem # 1:  RHINOSINUSITIS, ACUTE (ICD-461.8) 5 days of purulent nasal discharge and non prod cough.  use abx and nasal saline irrigation.  Patient advised to call office if symptoms persist or worsen.  His updated medication list for this problem includes:    Cefuroxime Axetil 500 Mg Tabs (Cefuroxime axetil) ..... One by mouth two times a day  Instructed on treatment. Call if symptoms persist or worsen.   Problem # 2:  CANDIDIASIS, ORAL (ICD-112.0) use clotrimazole troche as directed  Complete Medication List: 1)  Ambien Cr 6.25 Mg Cr-tabs (Zolpidem tartrate) .... One or two tabs by mouth qd 2)  Furosemide 40 Mg Tabs (Furosemide) .... Take 1 tablet by mouth once a day 3)  Levothyroxine Sodium 75 Mcg Tabs (Levothyroxine sodium) .... Take 1 tablet by mouth once a day 4)  Caltrate 600 1500 Mg Tabs (Calcium carbonate) .... Take 1 tablet by  mouth two times a day 5)  Simvastatin 40 Mg Tabs (Simvastatin) .... One by mouth qpm 6)  Voltaren 1 % Gel (Diclofenac sodium) .... Apply three times a day - qid 7)  Triamcinolone Acetonide 0.1 % Crea (Triamcinolone acetonide) .... Apply two times a day x 1 week 8)  Nexium 40 Mg Cpdr (Esomeprazole magnesium) .... One by mouth once daily 30 mins before am meal 9)  Cefuroxime Axetil 500 Mg Tabs (Cefuroxime axetil) .... One by mouth two times a day 10)  Clotrimazole 10 Mg Troc (Clotrimazole) .... Use 5 x day x 2 weeks  Patient Instructions: 1)  use Lloyd Huger med sinus rinse 2)  Call our office if your symptoms do not  improve or gets  worse. Prescriptions: CLOTRIMAZOLE 10 MG TROC (CLOTRIMAZOLE) use 5 x day x 2 weeks  #2 week x 0   Entered and Authorized by:   D. Thomos Lemons DO   Signed by:   D. Thomos Lemons DO on 08/12/2009   Method used:   Electronically to        [facility]  [phone]* (retail)       [address] [place], [place]  [address]       Ph: [phone] or [phone]       Fax: [phone]   RxID:   [id number] CEFUROXIME AXETIL 500 MG TABS (CEFUROXIME AXETIL) one by mouth two times a day  #20 x 0   Entered and Authorized by:   D. Thomos Lemons DO   Signed by:   D. Thomos Lemons DO on 08/12/2009   Method used:   Electronically to        [facility]  [phone]* (retail)       [address] [place], [place]  [address]       Ph: [phone] or [phone]       Fax: [phone]   RxID:   [id number]   Current Allergies (reviewed today): No known allergies

## 2010-09-10 NOTE — Assessment & Plan Note (Signed)
Summary: HOSPITAL FOLLOW UP/MHF--Rm 3   Vital Signs:  Patient profile:   77 year old male Height:      71 inches Weight:      190.25 pounds BMI:     26.63 O2 Sat:      97 % on Room air Temp:     97.7 degrees F oral Pulse rate:   68 / minute Pulse rhythm:   regular Resp:     18 per minute BP sitting:   132 / 72  (right arm) Cuff size:   large  Vitals Entered By: Mervin Kung CMA (AAMA) (April 30, 2010 11:08 AM)  O2 Flow:  Room air CC: Rm 3  Pt here for hospital follow up. Is Patient Diabetic? No Pain Assessment Patient in pain? no        Primary Care Provider:  Dondra Spry DO  CC:  Rm 3  Pt here for hospital follow up.Marland Kitchen  History of Present Illness: 77 y/o male for hosp f/u.  pt had total cyctectomy tolerated surgery well he reports some cysts around stoma site slowly getting back to his usual routine felt weak after his surgery  SOB is somewhat worse than usual some intermittent cough  a little unsteady on his feet no falls  Preventive Screening-Counseling & Management  Alcohol-Tobacco     Smoking Status: quit     Year Quit: 1990     Pack years: 80   Allergies (verified): No Known Drug Allergies  Past History:  Past Medical History: Prostate cancer, hx of (Dr. Vonita Moss S/P surgery and radiation) Congestive heart failure (preserved EF) COPD     Diverticulosis, colon  Pulmonary embolism, hx of  Aortic stenosis  ILD   Carotid stenosis (30% bilateral)  Urothelial carcinoma of the bladder  Past Surgical History: Aortic Valve Replacement (July 28, 2007 #21 Edwards  pericardial valve model 2700) Inguinal herniorrhaphy-left  Tonsillectomy    Transurethral resection of prostate  EGD- 05/08/2003 Colonoscopy-05/08/2003   L Hip replacement Radical Cystectomy--03/18/10 Ileal conduit urinary diversion--03/18/10    Family History: Mother - history of lung cancer Father - history of prostate cancer pat grandfather-deceased from TB (pt lived  with and was exposed to)  Was in the [employer] - sent to [facility]            Social History: Retired Pensions consultant - now Tree surgeon  Widowed - lives alone   4 living children    Alcohol use-yes (glass of wine per day)  Former Smoker q 15-20 yrs ago (80 pk year history)       Physical Exam  General:  alert, well-developed, and well-nourished.   Lungs:  normal respiratory effort and normal breath sounds. faint fine crackles at bases.   Heart:  normal rate, regular rhythm, and no gallop.   Extremities:  trace left pedal edema and trace right pedal edema.   Psych:  normally interactive, good eye contact, not anxious appearing, and not depressed appearing.     Impression & Recommendations:  Problem # 1:  BLADDER CANCER (ICD-188.9) dyspnea after surgery. rule out blood loss anemia  Orders: T-CBC No Diff (16109-60454)  Problem # 2:  DYSPNEA (ICD-786.05)  Orders: T-BNP  (B Natriuretic Peptide) (09811-91478)  Problem # 3:  ESSENTIAL HYPERTENSION, BENIGN (ICD-401.1)  His updated medication list for this problem includes:    Furosemide 40 Mg Tabs (Furosemide) .Marland Kitchen... Take 1 tablet by mouth once a day  Orders: T-Basic Metabolic Panel (29562-13086)  BP today: 132/72 Prior BP: 136/70 (02/23/2010)  Labs Reviewed: K+: 5.0 (02/23/2010) Creat: : 0.92 (02/23/2010)   Chol: 119 (05/23/2009)   HDL: 30 (05/23/2009)   LDL: 55 (05/23/2009)   TG: 172 (05/23/2009)  Complete Medication List: 1)  Ambien Cr 6.25 Mg Cr-tabs (Zolpidem tartrate) .... One tab by mouth at bedtime as needed 2)  Furosemide 40 Mg Tabs (Furosemide) .... Take 1 tablet by mouth once a day 3)  Levothyroxine Sodium 100 Mcg Tabs (Levothyroxine sodium) .... One by mouth once daily 4)  Caltrate 600 1500 Mg Tabs (Calcium carbonate) .... Take 1 tablet by mouth two times a day 5)  Simvastatin 10 Mg Tabs (Simvastatin) .... One by mouth once daily 6)  Nexium 40 Mg Cpdr (Esomeprazole magnesium) .... One by mouth once daily 30 mins before am  meal 7)  Ginkoba 40 Mg Tabs (Ginkgo biloba) .Marland Kitchen.. 1 by mouth daily 8)  Vitamin B Complex-c Caps (B complex-c) .Marland Kitchen.. 1 by mouth daily 9)  Beta Carotene Crys (Bulk chemicals) .Marland Kitchen.. 1 by mouth daily 10)  Ginseng 250 Mg Caps (Ginseng) .Marland Kitchen.. 1 podaily 11)  Selenium 200 Mcg Caps (Selenium) .Marland Kitchen.. 1 by mouth daily 12)  Melatonin 3 Mg Tabs (Melatonin) .Marland Kitchen.. 1 by mouth daily 13)  Alpha Lopic Acid  .Marland Kitchen.. 1 by mouth daily 14)  Coq-10 30 Mg Caps (Coenzyme q10) .Marland Kitchen.. 1 by mouth daily 15)  Oscal 500/200 D-3 500-200 Mg-unit Tabs (Calcium-vitamin d) .Marland Kitchen.. 1 by mouth daily 16)  Glucosamine-chondroitin Caps (Glucosamine-chondroit-vit c-mn) .Marland Kitchen.. 1 by mouth daily 17)  Acidophilus Caps (Lactobacillus) .Marland Kitchen.. 1 by mouth daily 18)  Zinc 25 Mg Tabs (Zinc) .Marland Kitchen.. 1 by mouth daily 19)  Claritin 10 Mg Tabs (Loratadine) .... As needed 20)  Lactulose 10 Gm/78ml Soln (Lactulose) .Marland Kitchen.. 10 gm by mouth once daily as needed for constipation  Other Orders: Flu Vaccine 55yrs + MEDICARE PATIENTS (Z6109) Administration Flu vaccine - MCR (U0454)  Patient Instructions: 1)  Stop enablex 2)  Please schedule a follow-up appointment in 3 months. Prescriptions: NEXIUM 40 MG CPDR (ESOMEPRAZOLE MAGNESIUM) one by mouth once daily 30 mins before AM meal  #90 x 1   Entered and Authorized by:   D. Thomos Lemons DO   Signed by:   D. Thomos Lemons DO on 04/30/2010   Method used:   Electronically to        [facility]  [phone][facility] (retail)       [address] [place], [place]  [address]       Ph: [phone] or [phone]       Fax: [phone]   RxID:   [id number] SIMVASTATIN 10 MG TABS (SIMVASTATIN) one by mouth once daily  #90 x 1   Entered and Authorized by:   D. Thomos Lemons DO   Signed by:   D. Thomos Lemons DO on 04/30/2010   Method used:   Electronically to        [facility]  [phone]* (retail)       [address] [place], [place]  [address]       Ph: [phone] or [phone]       Fax: [phone]   RxID:    [id number] LEVOTHYROXINE SODIUM 100 MCG TABS (LEVOTHYROXINE SODIUM) one by mouth once daily  #90 x 1   Entered and Authorized by:   D. Thomos Lemons DO   Signed by:   D. Thomos Lemons DO on 04/30/2010   Method used:   Electronically  to        [facility]  [phone]* (retail)       [address] [place], [place]  [address]       Ph: [phone] or [phone]       Fax: [phone]   RxID:   [id number]   Current Allergies (reviewed today): No known allergies    Flu Vaccine Consent Questions     Do you have a history of severe allergic reactions to this vaccine? no    Any prior history of allergic reactions to egg and/or gelatin? no    Do you have a sensitivity to the preservative Thimersol? no    Do you have a past history of Guillan-Barre Syndrome? no    Do you currently have an acute febrile illness? no    Have you ever had a severe reaction to latex? no    Vaccine information given and explained to patient? yes    Are you currently pregnant? no    Lot Number:[id number]   Exp Date:02/06/2011   Site Given  Left Deltoid IM. Nicki Guadalajara Fergerson CMA Duncan Dull)  April 30, 2010 11:36 AM

## 2010-09-10 NOTE — Progress Notes (Signed)
Summary: Furosemide Refill  Phone Note Refill Request Message from:  Fax from Pharmacy on August 29, 2009 12:09 PM  Refills Requested: Medication #1:  FUROSEMIDE 40 MG TABS Take 1 tablet by mouth once a day   Dosage confirmed as above?Dosage Confirmed Next Appointment Scheduled: 09/18/09 Dr.Yoo Initial call taken by: Michaelle Copas,  August 29, 2009 12:09 PM  Follow-up for Phone Call        Rx completed in Dr. Tiajuana Amass Follow-up by: Glendell Docker CMA,  August 29, 2009 4:46 PM    Prescriptions: FUROSEMIDE 40 MG TABS (FUROSEMIDE) Take 1 tablet by mouth once a day  #30 x 5   Entered by:   Glendell Docker CMA   Authorized by:   D. Thomos Lemons DO   Signed by:   Glendell Docker CMA on 08/29/2009   Method used:   Electronically to        [facility]  [phone]* (retail)       [address] [place], [place]  [address]       Ph: [phone] or [phone]       Fax: [phone]   RxID:   [id number]

## 2010-09-10 NOTE — Letter (Signed)
Summary: [facility]  [facility]   Imported By: Lanelle Bal 09/29/2009 08:36:09  _____________________________________________________________________  External Attachment:    Type:   Image     Comment:   External Document

## 2010-09-11 NOTE — Letter (Signed)
Summary: [facility] Office Visit Note   [facility] Office Visit Note   Imported By: Roderic Ovens 03/05/2010 16:02:13  _____________________________________________________________________  External Attachment:    Type:   Image     Comment:   External Document

## 2010-10-02 ENCOUNTER — Encounter: Payer: Self-pay | Admitting: Internal Medicine

## 2010-10-02 ENCOUNTER — Ambulatory Visit (HOSPITAL_COMMUNITY)
Admission: RE | Admit: 2010-10-02 | Discharge: 2010-10-02 | Disposition: A | Discharge status: Home / Self Care | Payer: Medicare Other | Source: Ambulatory Visit | Attending: Urology | Admitting: Urology

## 2010-10-02 ENCOUNTER — Other Ambulatory Visit: Payer: Self-pay | Admitting: Urology

## 2010-10-02 DIAGNOSIS — J449 Chronic obstructive pulmonary disease, unspecified: Secondary | ICD-10-CM | POA: Insufficient documentation

## 2010-10-02 DIAGNOSIS — J4489 Other specified chronic obstructive pulmonary disease: Secondary | ICD-10-CM | POA: Insufficient documentation

## 2010-10-02 DIAGNOSIS — C679 Malignant neoplasm of bladder, unspecified: Secondary | ICD-10-CM | POA: Insufficient documentation

## 2010-10-15 NOTE — Letter (Signed)
Summary: [facility]  [facility]   Imported By: Maryln Gottron 10/09/2010 15:24:11  _____________________________________________________________________  External Attachment:    Type:   Image     Comment:   External Document

## 2010-10-19 ENCOUNTER — Encounter: Payer: Self-pay | Admitting: Cardiology

## 2010-10-19 ENCOUNTER — Ambulatory Visit (INDEPENDENT_AMBULATORY_CARE_PROVIDER_SITE_OTHER): Payer: Medicare Other | Admitting: Cardiology

## 2010-10-19 DIAGNOSIS — I359 Nonrheumatic aortic valve disorder, unspecified: Secondary | ICD-10-CM

## 2010-10-19 DIAGNOSIS — I6529 Occlusion and stenosis of unspecified carotid artery: Secondary | ICD-10-CM

## 2010-10-23 LAB — CBC
HCT: 27.7 % — ABNORMAL LOW (ref 39.0–52.0)
HCT: 33.5 % — ABNORMAL LOW (ref 39.0–52.0)
HCT: 40.7 % (ref 39.0–52.0)
Hemoglobin: 10 g/dL — ABNORMAL LOW (ref 13.0–17.0)
Hemoglobin: 11.6 g/dL — ABNORMAL LOW (ref 13.0–17.0)
Hemoglobin: 14 g/dL (ref 13.0–17.0)
MCH: 32.1 pg (ref 26.0–34.0)
MCH: 31.5 pg (ref 26.0–34.0)
MCH: 32.2 pg (ref 26.0–34.0)
MCH: 32.2 pg (ref 26.0–34.0)
MCH: 32.4 pg (ref 26.0–34.0)
MCHC: 33.9 g/dL (ref 30.0–36.0)
MCHC: 33.3 g/dL (ref 30.0–36.0)
MCHC: 33.8 g/dL (ref 30.0–36.0)
MCHC: 34.3 g/dL (ref 30.0–36.0)
MCHC: 34.4 g/dL (ref 30.0–36.0)
MCHC: 34.6 g/dL (ref 30.0–36.0)
MCHC: 34.6 g/dL (ref 30.0–36.0)
MCV: 92.8 fL (ref 78.0–100.0)
MCV: 93 fL (ref 78.0–100.0)
MCV: 93.2 fL (ref 78.0–100.0)
MCV: 94.1 fL (ref 78.0–100.0)
Platelets: 157 10*3/uL (ref 150–400)
Platelets: 174 10*3/uL (ref 150–400)
Platelets: 192 10*3/uL (ref 150–400)
Platelets: 129 10*3/uL — ABNORMAL LOW (ref 150–400)
Platelets: 171 10*3/uL (ref 150–400)
RBC: 3.1 MIL/uL — ABNORMAL LOW (ref 4.22–5.81)
RBC: 4.35 MIL/uL (ref 4.22–5.81)
RDW: 14.5 % (ref 11.5–15.5)
RDW: 14.8 % (ref 11.5–15.5)
RDW: 14.9 % (ref 11.5–15.5)
RDW: 14.3 % (ref 11.5–15.5)
RDW: 14.6 % (ref 11.5–15.5)
RDW: 14.6 % (ref 11.5–15.5)
RDW: 14.7 % (ref 11.5–15.5)
WBC: 5 10*3/uL (ref 4.0–10.5)
WBC: 5.4 10*3/uL (ref 4.0–10.5)
WBC: 7.9 10*3/uL (ref 4.0–10.5)

## 2010-10-23 LAB — BASIC METABOLIC PANEL
BUN: 17 mg/dL (ref 6–23)
BUN: 19 mg/dL (ref 6–23)
BUN: 21 mg/dL (ref 6–23)
BUN: 16 mg/dL (ref 6–23)
BUN: 20 mg/dL (ref 6–23)
BUN: 21 mg/dL (ref 6–23)
CO2: 25 mEq/L (ref 19–32)
CO2: 27 mEq/L (ref 19–32)
CO2: 24 mEq/L (ref 19–32)
CO2: 26 mEq/L (ref 19–32)
CO2: 26 mEq/L (ref 19–32)
CO2: 26 mEq/L (ref 19–32)
Calcium: 7.4 mg/dL — ABNORMAL LOW (ref 8.4–10.5)
Calcium: 7.2 mg/dL — ABNORMAL LOW (ref 8.4–10.5)
Calcium: 7.4 mg/dL — ABNORMAL LOW (ref 8.4–10.5)
Calcium: 7.4 mg/dL — ABNORMAL LOW (ref 8.4–10.5)
Calcium: 7.5 mg/dL — ABNORMAL LOW (ref 8.4–10.5)
Chloride: 103 mEq/L (ref 96–112)
Chloride: 105 mEq/L (ref 96–112)
Chloride: 106 mEq/L (ref 96–112)
Creatinine, Ser: 1.23 mg/dL (ref 0.4–1.5)
Creatinine, Ser: 1.46 mg/dL (ref 0.4–1.5)
Creatinine, Ser: 1.1 mg/dL (ref 0.4–1.5)
Creatinine, Ser: 1.23 mg/dL (ref 0.4–1.5)
GFR calc Af Amer: >60 mL/min (ref >60)
GFR calc Af Amer: >60 mL/min (ref >60)
GFR calc Af Amer: >60 mL/min (ref >60)
GFR calc Af Amer: >60 mL/min (ref >60)
GFR calc Af Amer: >60 mL/min (ref >60)
GFR calc Af Amer: >60 mL/min (ref >60)
GFR calc non Af Amer: 56 mL/min — ABNORMAL LOW (ref >60)
GFR calc non Af Amer: >60 mL/min (ref >60)
GFR calc non Af Amer: >60 mL/min (ref >60)
GFR calc non Af Amer: >60 mL/min (ref >60)
GFR calc non Af Amer: 50 mL/min — ABNORMAL LOW (ref >60)
GFR calc non Af Amer: 56 mL/min — ABNORMAL LOW (ref >60)
GFR calc non Af Amer: >60 mL/min (ref >60)
GFR calc non Af Amer: >60 mL/min (ref >60)
Glucose, Bld: 117 mg/dL — ABNORMAL HIGH (ref 70–99)
Glucose, Bld: 118 mg/dL — ABNORMAL HIGH (ref 70–99)
Glucose, Bld: 103 mg/dL — ABNORMAL HIGH (ref 70–99)
Glucose, Bld: 139 mg/dL — ABNORMAL HIGH (ref 70–99)
Glucose, Bld: 147 mg/dL — ABNORMAL HIGH (ref 70–99)
Glucose, Bld: 182 mg/dL — ABNORMAL HIGH (ref 70–99)
Potassium: 3.3 mEq/L — ABNORMAL LOW (ref 3.5–5.1)
Potassium: 3.5 mEq/L (ref 3.5–5.1)
Potassium: 3.5 mEq/L (ref 3.5–5.1)
Potassium: 3.5 mEq/L (ref 3.5–5.1)
Potassium: 4 mEq/L (ref 3.5–5.1)
Potassium: 4.2 mEq/L (ref 3.5–5.1)
Sodium: 137 mEq/L (ref 135–145)
Sodium: 137 mEq/L (ref 135–145)
Sodium: 137 mEq/L (ref 135–145)
Sodium: 140 mEq/L (ref 135–145)
Sodium: 134 mEq/L — ABNORMAL LOW (ref 135–145)
Sodium: 135 mEq/L (ref 135–145)
Sodium: 137 mEq/L (ref 135–145)
Sodium: 138 mEq/L (ref 135–145)
Sodium: 142 mEq/L (ref 135–145)

## 2010-10-23 LAB — SURGICAL PCR SCREEN
MRSA, PCR: NEGATIVE
Staphylococcus aureus: NEGATIVE

## 2010-10-23 LAB — CROSSMATCH: Antibody Screen: NEGATIVE

## 2010-10-23 LAB — POCT I-STAT 4, (NA,K, GLUC, HGB,HCT)
HCT: 32 % — ABNORMAL LOW (ref 39.0–52.0)
Sodium: 137 mEq/L (ref 135–145)

## 2010-10-23 LAB — CREATININE, FLUID (PLEURAL, PERITONEAL, JP DRAINAGE): Creat, Fluid: 1 mg/dL

## 2010-10-23 LAB — ABO/RH: ABO/RH(D): O POS

## 2010-10-25 LAB — COMPREHENSIVE METABOLIC PANEL
Albumin: 3.6 g/dL (ref 3.5–5.2)
Alkaline Phosphatase: 117 U/L (ref 39–117)
BUN: 27 mg/dL — ABNORMAL HIGH (ref 6–23)
Calcium: 8.7 mg/dL (ref 8.4–10.5)
Creatinine, Ser: 1.24 mg/dL (ref 0.4–1.5)
Glucose, Bld: 105 mg/dL — ABNORMAL HIGH (ref 70–99)
Potassium: 4 mEq/L (ref 3.5–5.1)
Total Protein: 6.4 g/dL (ref 6.0–8.3)

## 2010-10-25 LAB — CBC
HCT: 42.6 % (ref 39.0–52.0)
MCHC: 32.8 g/dL (ref 30.0–36.0)
MCV: 94.5 fL (ref 78.0–100.0)
Platelets: 143 10*3/uL — ABNORMAL LOW (ref 150–400)
RDW: 15.2 % (ref 11.5–15.5)
WBC: 6.7 10*3/uL (ref 4.0–10.5)

## 2010-10-27 ENCOUNTER — Telehealth: Payer: Self-pay | Admitting: Family

## 2010-10-27 ENCOUNTER — Ambulatory Visit (INDEPENDENT_AMBULATORY_CARE_PROVIDER_SITE_OTHER): Payer: Medicare Other | Admitting: Family

## 2010-10-27 ENCOUNTER — Encounter: Payer: Self-pay | Admitting: Family

## 2010-10-27 ENCOUNTER — Ambulatory Visit (HOSPITAL_BASED_OUTPATIENT_CLINIC_OR_DEPARTMENT_OTHER)
Admission: RE | Admit: 2010-10-27 | Discharge: 2010-10-27 | Disposition: A | Discharge status: Home / Self Care | Payer: Medicare Other | Source: Ambulatory Visit | Attending: Family | Admitting: Family

## 2010-10-27 VITALS — BP 110/70 | HR 66 | Temp 98.7°F | Resp 18 | Ht 71.0 in | Wt 194.0 lb

## 2010-10-27 DIAGNOSIS — R05 Cough: Secondary | ICD-10-CM | POA: Insufficient documentation

## 2010-10-27 DIAGNOSIS — J449 Chronic obstructive pulmonary disease, unspecified: Secondary | ICD-10-CM | POA: Insufficient documentation

## 2010-10-27 DIAGNOSIS — J9819 Other pulmonary collapse: Secondary | ICD-10-CM | POA: Insufficient documentation

## 2010-10-27 DIAGNOSIS — R059 Cough, unspecified: Secondary | ICD-10-CM | POA: Insufficient documentation

## 2010-10-27 LAB — COMPREHENSIVE METABOLIC PANEL
AST: 22 U/L (ref 0–37)
BUN: 29 mg/dL — ABNORMAL HIGH (ref 6–23)
CO2: 29 mEq/L (ref 19–32)
Calcium: 8.6 mg/dL (ref 8.4–10.5)
Chloride: 102 mEq/L (ref 96–112)
Creatinine, Ser: 1.19 mg/dL (ref 0.4–1.5)
GFR calc Af Amer: >60 mL/min (ref >60)
GFR calc non Af Amer: 58 mL/min — ABNORMAL LOW (ref >60)
Glucose, Bld: 119 mg/dL — ABNORMAL HIGH (ref 70–99)
Total Bilirubin: 1.1 mg/dL (ref 0.3–1.2)

## 2010-10-27 LAB — CBC
HCT: 42 % (ref 39.0–52.0)
Hemoglobin: 14.2 g/dL (ref 13.0–17.0)
MCHC: 33.8 g/dL (ref 30.0–36.0)
MCV: 94.7 fL (ref 78.0–100.0)
RBC: 4.44 MIL/uL (ref 4.22–5.81)
WBC: 6.9 10*3/uL (ref 4.0–10.5)

## 2010-10-27 MED ORDER — BUDESONIDE-FORMOTEROL FUMARATE 160-4.5 MCG/ACT IN AERO: 2.0000 | INHALATION_SPRAY | Freq: Two times a day (BID) | RESPIRATORY_TRACT | Status: DC

## 2010-10-27 NOTE — Assessment & Plan Note (Signed)
77 year old male with history of COPD, presents today with several month history of cough. Chest x-ray performed today is negative. He notes chronic shortness of breath. He has been seen by Dr. Solon Augusta in December of last year. Will give patient a trial of symbicort, and have him take follow up with Dr. Artist Pais in one month.

## 2010-10-27 NOTE — Assessment & Plan Note (Signed)
Summary: 1 yr rov AS pfh/sp      Allergies Added: NKDA  Visit Type:  Follow-up Primary Provider:  Dondra Spry DO  CC:  Aortic Valve Replacement.  History of Present Illness: The patient presents for routine followup. Since I last saw him he had radical cystectomy and ilial conduit with for transitional cell cancer. He did well with this procedure. He has been doing very well from a cardiovascular standpoint. He has had some rare lightheadedness but none recently. He doesn't report any palpitations and has had no syncope. He denies any dyspnea, PND or orthopnea. He has not had chest pressure, neck or arm discomfort. 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 He is slightly unsteady on his feet but careful when walking.  Current Medications (verified): 1)  Ambien Cr 6.25 Mg Cr-Tabs (Zolpidem Tartrate) .... One Tab By Mouth At Bedtime As Needed 2)  Furosemide 40 Mg Tabs (Furosemide) .... Take 1 Tablet By Mouth Once A Day 3)  Levothyroxine Sodium 100 Mcg Tabs (Levothyroxine Sodium) .... One By Mouth Once Daily 4)  Caltrate 600 1500 Mg  Tabs (Calcium Carbonate) .... Take 1 Tablet By Mouth Two Times A Day 5)  Simvastatin 10 Mg Tabs (Simvastatin) .... One By Mouth Once Daily 6)  Nexium 40 Mg Cpdr (Esomeprazole Magnesium) .... One By Mouth Once Daily 30 Mins Before Am Meal 7)  Ginkoba 40 Mg Tabs (Ginkgo Biloba) .Marland Kitchen.. 1 By Mouth Daily 8)  Vitamin B Complex-C   Caps (B Complex-C) .Marland Kitchen.. 1 By Mouth Daily 9)  Beta Carotene  Crys (Bulk Chemicals) .Marland Kitchen.. 1 By Mouth Daily 10)  Ginseng 250 Mg Caps (Ginseng) .Marland Kitchen.. 1 Podaily 11)  Selenium 200 Mcg Caps (Selenium) .Marland Kitchen.. 1 By Mouth Daily 12)  Melatonin 3 Mg Tabs (Melatonin) .Marland Kitchen.. 1 By Mouth Daily 13)  Alpha Lopic Acid .Marland Kitchen.. 1 By Mouth Daily 14)  Coq-10 30 Mg Caps (Coenzyme Q10) .Marland Kitchen.. 1 By Mouth Daily 15)  Oscal 500/200 D-3 500-200 Mg-Unit Tabs (Calcium-Vitamin D) .Marland Kitchen.. 1 By Mouth Daily 16)  Glucosamine-Chondroitin  Caps (Glucosamine-Chondroit-Vit C-Mn) .Marland Kitchen.. 1 By  Mouth Daily 17)  Acidophilus  Caps (Lactobacillus) .Marland Kitchen.. 1 By Mouth Daily 18)  Zinc 25 Mg Tabs (Zinc) .Marland Kitchen.. 1 By Mouth Daily 19)  Claritin 10 Mg Tabs (Loratadine) .... As Needed 20)  Lactulose 10 Gm/71ml Soln (Lactulose) .Marland Kitchen.. 10 Gm By Mouth Once Daily As Needed For Constipation  Allergies (verified): No Known Drug Allergies  Past History:  Past Medical History: Reviewed history from 04/30/2010 and no changes required. Prostate cancer, hx of (Dr. Vonita Moss S/P surgery and radiation) Congestive heart failure (preserved EF) COPD     Diverticulosis, colon  Pulmonary embolism, hx of  Aortic stenosis  ILD   Carotid stenosis (30% bilateral)  Urothelial carcinoma of the bladder  Past Surgical History: Aortic Valve Replacement (July 28, 2007 #21 Edwards  pericardial valve model 2700) Inguinal herniorrhaphy-left  Tonsillectomy    Transurethral resection of prostate  EGD- 05/08/2003 Colonoscopy-05/08/2003   L Hip replacement Radical Cystectomy--03/18/10 Ileal conduit urinary diversion--03/18/10    Review of Systems       As stated in the HPI and negative for all other systems.   Vital Signs:  Patient profile:   77 year old male Height:      71 inches Weight:      193 pounds BMI:     27.02 Pulse rate:   52 / minute Resp:     16 per minute BP sitting:   128 /  72  (right arm)  Vitals Entered By: Marrion Coy, CNA (October 19, 2010 9:58 AM)  Physical Exam  General:  Well developed, well nourished, in no acute distress. Head:  normocephalic and atraumatic Eyes:  PERRLA/EOM intact; conjunctiva and lids normal. Mouth:  Teeth, gums and palate normal. Oral mucosa normal. Neck:  supple and no neck tenderness.   Chest Wall:  well-healed sternotomy scar Lungs:  normal respiratory effort and normal breath sounds. faint fine crackles at bases.   Abdomen:  soft, non-tender, and normal bowel sounds.   Msk:  Back normal, normal gait. Muscle strength and tone normal for age Extremities:   trace left pedal edema and trace right pedal edema.   Neurologic:  cranial nerves II-XII intact and gait normal.   Skin:  Intact without lesions or rashes. Cervical Nodes:  no significant adenopathy Inguinal Nodes:  no significant adenopathy Psych:  normally interactive, good eye contact, not anxious appearing, and not depressed appearing.     Detailed Cardiovascular Exam  Neck    Carotids: Carotids full and equal bilaterally without bruits.      Neck Veins: Normal, no JVD.    Heart    Inspection: no deformities or lifts noted.      Palpation: normal PMI with no thrills palpable.      Auscultation: S1 and S2 within normal limits, no S3, no S4, 2/6 apical systolic murmur nonradiating, no diastolic murmurs  Vascular    Abdominal Aorta: no palpable masses, pulsations, or audible bruits.      Femoral Pulses: normal femoral pulses bilaterally.      Pedal Pulses: pulses normal in all 4 extremities    Radial Pulses: normal radial pulses bilaterally.      Peripheral Circulation: no clubbing, cyanosis, or edema noted with normal capillary refill.     EKG  Procedure date:  10/19/2010  Findings:      sinus bradycardia, rate 56, premature ventricular contractions, axis within normal limits, intervals within normal limits, no acute ST-T wave changes  Impression & Recommendations:  Problem # 1:  AORTIC STENOSIS (ICD-424.1) He is doing very well. No further imaging is indicated. No change in therapy is indicated.  Problem # 2:  ELECTROCARDIOGRAM, ABNORMAL (ICD-794.31) He does have sinus bradycardia but no symptoms. Should he get any further dizziness he was in no another evaluate this probably with the monitor. Orders: EKG w/ Interpretation (93000)  Problem # 3:  CAROTID ARTERY DISEASE (ICD-433.10) He is overdue for carotid Dopplers. Orders: EKG w/ Interpretation (93000) Carotid Duplex (Carotid Duplex)  Patient Instructions: 1)  Your physician recommends that you schedule a  follow-up appointment in: 1 yr with Dr Antoine Poche 2)  Your physician recommends that you continue on your current medications as directed. Please refer to the Current Medication list given to you today. 3)  Your physician has requested that you have a carotid duplex. This test is an ultrasound of the carotid arteries in your neck. It looks at blood flow through these arteries that supply the brain with blood. Allow one hour for this exam. There are no restrictions or special instructions.

## 2010-10-27 NOTE — Telephone Encounter (Signed)
Please call patient and let him know that his chest x-ray is negative.  I have sent a prescription to his pharmacy for symbicort.  2 pufffs twice daily.  He should follow up with Dr. Artist Pais in 1 month- sooner if symptoms worsen or do not improve.

## 2010-10-27 NOTE — Telephone Encounter (Signed)
Pt advised and verbalizes understanding

## 2010-10-27 NOTE — Progress Notes (Signed)
  Subjective:    Patient ID: [name], male    DOB: 08-Oct-1920, 77 y.o.   MRN: [id number]  HPI Mr.  Miles is an 77 year old male with history of COPD who presents today with chief complaint of cough Cough started around [facility] 2011.  Cough is improved with use of mucinex.  Cough has waxed and waned in intensity.  Cough is productive of clear sputum.  Denies associated fever.  Notes trouble sleeping.  Wakes up about every 2 hours through the night.  Feels that he also has some associated nasal congestion.  Denies history of allergies.     Review of Systems    Denies history of chest pain. + SOB which is chronic and is at baseline.  Denies lower extremity edema.   Objective:   Physical Exam QIH:[id number] white male who appears younger than stated age, awake, alert and in no acute distress. Neck: supple no JVD no masses Ears bilateral hearing aids noted: Tympanic membranes are intact without bulging or erythema. Cardiac: S1-S2 regular rate and rhythm no murmurs noted Respiratory: breathing is even and nonlabored. Breath sounds are clear to auscultation bilaterally without wheezes rales or rhonchi Psychiatric: A&O. x3 calm and pleasant.       Assessment & Plan:

## 2010-11-06 ENCOUNTER — Telehealth (INDEPENDENT_AMBULATORY_CARE_PROVIDER_SITE_OTHER): Payer: Medicare Other | Admitting: Internal Medicine

## 2010-11-06 DIAGNOSIS — E785 Hyperlipidemia, unspecified: Secondary | ICD-10-CM

## 2010-11-06 MED ORDER — SIMVASTATIN 10 MG PO TABS: 10.0000 mg | ORAL_TABLET | Freq: Every day | ORAL | Status: DC

## 2010-11-06 NOTE — Telephone Encounter (Signed)
Refill- simvastatin 10mg  tablet. Take one tablet every day. Qty 90. Last fill 12.27.11

## 2010-11-06 NOTE — Telephone Encounter (Signed)
rx refill sent to pharmacy 

## 2010-11-09 ENCOUNTER — Telehealth (INDEPENDENT_AMBULATORY_CARE_PROVIDER_SITE_OTHER): Payer: Medicare Other | Admitting: Internal Medicine

## 2010-11-09 DIAGNOSIS — E039 Hypothyroidism, unspecified: Secondary | ICD-10-CM

## 2010-11-09 DIAGNOSIS — I10 Essential (primary) hypertension: Secondary | ICD-10-CM

## 2010-11-09 MED ORDER — LEVOTHYROXINE SODIUM 100 MCG PO TABS: 100.0000 ug | ORAL_TABLET | Freq: Every day | ORAL | Status: DC

## 2010-11-09 MED ORDER — FUROSEMIDE 40 MG PO TABS: 40.0000 mg | ORAL_TABLET | Freq: Every day | ORAL | Status: DC

## 2010-11-09 NOTE — Telephone Encounter (Signed)
Refill- furosemide 40mg  tablet. Take 1 tablet by mouth once daily. Qty 30. Last fill 3.21.12  Refill- levothyroxine tablet. Take 1 tablet every day. Qty 90. Last fill 1.2.12

## 2010-11-09 NOTE — Telephone Encounter (Signed)
rx refill sent to pharmacy 

## 2010-11-19 LAB — POCT I-STAT, CHEM 8
BUN: 24 mg/dL — ABNORMAL HIGH (ref 6–23)
Creatinine, Ser: 1.1 mg/dL (ref 0.4–1.5)
Glucose, Bld: 98 mg/dL (ref 70–99)
Potassium: 4.2 mEq/L (ref 3.5–5.1)
Sodium: 140 mEq/L (ref 135–145)
TCO2: 28 mmol/L (ref 0–100)

## 2010-11-19 LAB — POCT I-STAT 4, (NA,K, GLUC, HGB,HCT)
Glucose, Bld: 100 mg/dL — ABNORMAL HIGH (ref 70–99)
HCT: 48 % (ref 39.0–52.0)
Sodium: 138 mEq/L (ref 135–145)

## 2010-12-01 ENCOUNTER — Telehealth: Payer: Self-pay | Admitting: Internal Medicine

## 2010-12-01 NOTE — Telephone Encounter (Signed)
Request for a refill or new prescription  Sertraline hcl 100mg  tablet. Take 1 tablet twice a day. Qty 180.0 ea. Last fill 2.24.12

## 2010-12-02 NOTE — Telephone Encounter (Signed)
Call placed to patient at [phone] no answer, voice recording stating message could not be left at this time please try your call again later. [facility] [phone]-spoke with Glorianne Manchester he confirmed there was no Rx on file for this medication for patient. Medication request was not approved. Rx  Refill request may have been transcribed in error.

## 2010-12-03 ENCOUNTER — Telehealth: Payer: Self-pay | Admitting: Internal Medicine

## 2010-12-03 NOTE — Telephone Encounter (Signed)
Refill- furosemide 40mg  tablet. Take 1 tablet by mouth once daily. Qty 30.0 ea. Last fill 4.2.12

## 2010-12-04 NOTE — Telephone Encounter (Signed)
[facility] at [phone], Spoke with Zella Ball, she has verified  refill request on file from April 2. 2012. No refills needed at this time

## 2010-12-08 ENCOUNTER — Telehealth: Payer: Self-pay | Admitting: Internal Medicine

## 2010-12-08 NOTE — Telephone Encounter (Signed)
See phone note from 4/26. Attempted to contact patient at [phone] recording reached stating number is not in service, or has been changed

## 2010-12-08 NOTE — Telephone Encounter (Signed)
NEEDS REFILL [facility] [address]    PATIENT PHONE NUMBER [phone]

## 2010-12-17 ENCOUNTER — Encounter: Payer: Self-pay | Admitting: Internal Medicine

## 2010-12-18 ENCOUNTER — Telehealth (INDEPENDENT_AMBULATORY_CARE_PROVIDER_SITE_OTHER): Payer: Medicare Other | Admitting: Internal Medicine

## 2010-12-18 ENCOUNTER — Ambulatory Visit: Payer: Medicare Other | Admitting: Internal Medicine

## 2010-12-18 DIAGNOSIS — K5909 Other constipation: Secondary | ICD-10-CM

## 2010-12-18 MED ORDER — LACTULOSE 10 GM/15ML PO SOLN: 10.0000 g | Freq: Every day | ORAL | Status: DC

## 2010-12-18 NOTE — Telephone Encounter (Signed)
Rx refill sent to pharmacy. 

## 2010-12-18 NOTE — Telephone Encounter (Signed)
Lactulose 10 gm/75ml solution 473 ml take 15ml every day as needed last fill 11-20-10

## 2010-12-22 NOTE — Op Note (Signed)
[name], [name]          ACCOUNT NO.:  [id number]   MEDICAL RECORD NO.:  [id number]          PATIENT TYPE:  AMB   LOCATION:  [facility]                         FACILITY:  [facility]   PHYSICIAN:  Maretta Bees. Vonita Moss, M.D.DATE OF BIRTH:  03-13-1921   DATE OF PROCEDURE:  11/01/2008  DATE OF DISCHARGE:                               OPERATIVE REPORT   Patient [name] [facility]   PREOPERATIVE DIAGNOSIS:  Hematuria, rule out carcinoma in situ of the  bladder.   POSTOPERATIVE DIAGNOSIS:  Hematuria, rule out carcinoma in situ of the  bladder.   PROCEDURE:  Cystoscopy, bilateral retrograde pyelograms with  interpretation, and cold cup bladder biopsy.   SURGEON:  Maretta Bees. Vonita Moss, M.D.   ANESTHESIA:  General.   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  He had some recent gross hematuria and  cystoscopy revealed hemorrhagic areas in the bladder that were worrisome  for carcinoma in situ.  A FISH test was done which was positive.  He is  brought to the OR today for cysto and bladder biopsy and retrograde  pyelography.   PROCEDURE:  The patient was brought to the operating room and placed in  lithotomy position and external genitalia prepped and draped in the  usual fashion.  He required dilation of a urethral meatal stenosis  before I could cystoscope him.  The anterior urethra was normal.  The  prostate was absent.  The bladder had increased vascularity and  telangiectasis mainly on the bladder base.  There were no papillary  tumors or stones.   Utilizing the cystoscope and a cone-tipped ureteral catheter, bilateral  retrograde pyelograms were obtained that showed normal upper tracts with  no obstruction or filling defects.  There was an air bubble that I  followed fluoroscopically in the right ureter, but the study was normal.   I then used  the cold cup bladder biopsy forceps to biopsy 4 typical  hemorrhagic areas across the bladder base, and fulgurated the biopsy  sites with the Bugbee electrode.  The bladder was emptied, the scope  removed, and the patient sent to the recovery room in good condition  with essentially no blood loss, and having tolerated the procedure well      Maretta Bees. Vonita Moss, M.D.  Electronically Signed     LJP/MEDQ  D:  11/01/2008  T:  11/01/2008  Job:  [id number]   cc:   Barbette Hair. [place], DO  [address] [place], [place] [address]   Rollene Rotunda, MD, [facility]  [address]  [address]  [place]  [place] [address]

## 2010-12-22 NOTE — Assessment & Plan Note (Signed)
[facility]                            CARDIOLOGY OFFICE NOTE   [name], [name]                 MRN:          [id number]  DATE:12/22/2007                            DOB:          [birth year]    PRIMARY:  Dr. Dondra Spry.   RECENT FOR PRESENTATION:  Evaluate patient with dyspnea and aortic  stenosis.   HISTORY OF PRESENT ILLNESS:  The patient is now a pleasant 77 year old  gentleman.  He has been getting along relatively well, although he has  been developing increasing dyspnea for the last 3 months.  He short of  breath walking a moderate distance on level ground.  This has been  slowly progressive.  Recently, he has had some cough and increased  dyspnea.  He has been seen by Dr. Artist Pais and is being treated for  pneumonia.  His CT demonstrated some mild bilateral ground-glass  appearance in the right lung base.   The patient is denying any PND or orthopnea.  He is not having  palpitations, presyncope or syncope.  He has not been having any chest  discomfort, neck or arm discomfort.  He does try to do a little exercise  in a facility near where he lives.  However, he finds that doing things  like trying to walk to his granddaughter's soccer game is becoming more  problematic.   PAST MEDICAL HISTORY:  Aortic stenosis (status post aortic valve  replacement, #21 mm Edwards pericardial valve, model 2700 by Dr. Donata Clay on July 28, 2007), well-preserved ejection fraction,  nonobstructive coronary disease, sleep apnea (the patient stopped using  CPAP), carotid stenosis (30% bilateral internal carotid stenosis and  chronic right external carotid artery occlusion), pulmonary embolism in  1994, prostate cancer, hiatal hernia, chronic obstructive pulmonary  disease, degenerative joint disease, left inguinal hernia,  tonsillectomy, left hip replacement.   ALLERGIES:  NONE.   MEDICATIONS:  1. Pravastatin 20 mg nightly.  2. Aspirin 81 mg  daily.  3. Fish oil.  4. Spiriva.  5. Caltrate 600 mg b.i.d.  6. Levoxyl 25 mcg daily.  7. Prilosec 20 mg daily.  8. Ambien CR 6.25 mg p.o. nightly.  9. Vitamin C 500 mg p.o. daily.  10.Furosemide 40 mg daily.   REVIEW OF SYSTEMS:  As stated in the HPI and negative for other systems.   PHYSICAL EXAMINATION:  GENERAL:  The patient is in no distress.  VITAL SIGNS:  Blood pressure 116/77, heart rate 50 and regular, weight  200 pounds, body mass index 29.  HEENT:  Eyelids unremarkable, pupils equal, round and reactive to  light., fundi not visualized, oral mucosa unremarkable.  NECK:  No jugulovenous distention at 45 degrees, carotid upstroke brisk  and symmetrical.  No bruits, no thyromegaly.  LYMPHATICS:  No cervical, axillary or inguinal adenopathy.  LUNGS:  Bilateral basilar fine crackles, no wheezing.  BACK:  No costovertebral angle tenderness.  CHEST:  Well-healed sternotomy scar.  HEART:  PMI not displaced or sustained, S1-S2 within normal.  No S3, no  S4, no clicks, no rubs, no murmurs.  ABDOMEN:  Obese, positive bowel sounds, normal  in frequency and pitch.  No bruits, no rebound, no guarding, no midline pulsatile mass.  No  hepatomegaly no splenomegaly.  SKIN:  No rashes, no nodules.  EXTREMITIES:  2+ pulses throughout.  No edema, no cyanosis, no clubbing.  NEURO:  Oriented to person, place and time.  Cranial nerves II-XII  grossly intact.  Motor grossly intact.   EKG:  Sinus bradycardia, rate 49, axis within normal limits.  Intervals  within normal limits, RSR prime V1-V2, incomplete right bundle branch  block, no acute ST-wave changes.   ASSESSMENT/PLAN:  1. Aortic stenosis.  The patient now has increased dyspnea.  I am      going to check a brain natriuretic peptide level.  He has not had      an echo since his surgery.  Will go ahead and check this to      evaluate left ventricular function and his aortic valve      replacement.  2. Dyspnea as above.  He also has  an appointment with Dr. Delton Coombes.  This      may be a primary pulmonary etiology.  3. Carotid stenosis.  He is due for carotid Dopplers.  We will arrange      this.  4. Atrial flutter.  He has had no further arrhythmia since ablation.      No further therapy is warranted.  5. Obesity.  The patient understands he needs to lose weight.  He is      not actually obese by his body mass index, but he is close.  6. Dyslipidemia.  He is having this followed by Dr. Artist Pais and has good      low-density lipoprotein.   FOLLOW-UP:  I would like to see him back in about 6 months.     Rollene Rotunda, MD, [facility]  Electronically Signed    [name]  DD: 12/22/2007  DT: 12/22/2007  Job #: [id number]   cc:   Barbette Hair. Artist Pais, DO

## 2010-12-22 NOTE — Assessment & Plan Note (Signed)
[name]                            CARDIOLOGY OFFICE NOTE   [name], [name]                 MRN:          [id number]  DATE:06/10/2008                            DOB:          04-22-1921    PRIMARY CARE PHYSICIAN:  Wesley Hair. Wesley Pais, DO.   REASON FOR PRESENTATION:  Evaluate the patient with aortic stenosis.   HISTORY OF PRESENT ILLNESS:  The patient is 77 years old.  He has done  very well since I last saw him.  He was complaining of some shortness of  breath.  However, he started doing some more exercising, deep breathing,  and coughing and he said he is doing much better.  He is now able to do  more a aerobic activities as well as some light weights.  He is not  getting particularly dyspneic.  He is not having any chest discomfort.  He is not having any neck or arm discomfort.  He is having no  palpitation, presyncope, or syncope.  He denies any PND or orthopnea.  He does have a cough, productive of some clear white sputum particularly  at night.  Once he gets this up and goes to bed, he sleeps well.   PAST MEDICAL HISTORY:  Aortic stenosis (status post aortic valve  replacement, #21 mm Edwards Pericardial Valve Model 2700 by Dr. Donata Miles in July 28, 2007), well-preserved ejection fraction,  nonobstructive coronary artery disease, sleep apnea (the patient was  started on a CPAP), carotid stenosis 30%, bilateral internal carotid  stenosis, and chronic right external carotid artery occlusion),  pulmonary embolism in 1994, prostate cancer, hiatal hernia, chronic  obstructive pulmonary disease, degenerative joint disease, left inguinal  hernia repair, tonsillectomy, left hip replacement.   ALLERGIES:  None.   MEDICATIONS:  1. Pravastatin 20 mg daily.  2. Fish oil.  3. Klor-Con 20 mEq daily.  4. Melatonin.  5. Furosemide 40 mg daily.  6. Vitamin C.  7. Ambien 6.25 mg nightly.  8. Prilosec 20 mg daily.  9. Levoxyl 25 mcg  daily.  10.Caltrate.  11.Spiriva.  12.Aspirin 81 mg daily.   REVIEW OF SYSTEMS:  As stated in the HPI and otherwise negative for  other systems.   PHYSICAL EXAMINATION:  GENERAL:  The patient is in no distress.  Blood  pressure 138/82, heart rate 56 and regular, weight 199 pounds, body mass  index 29.  HEENT:  Eyes are unremarkable, pupils are equal, round, and  reactive to light.  Fundi not visualized, oral mucosa unremarkable.  NECK:  No jugular venous distention at 45 degrees, carotid upstroke  brisk and symmetrical.  No bruits, no thyromegaly.  LYMPHATICS:  No adenopathy.  LUNGS:  Clear to auscultation bilaterally.  BACK:  No costovertebral angle tenderness.  CHEST:  Well-healed sternotomy scar.  HEART:  PMI not displaced or sustained, S1 and S2 within normals, no S3,  no S4, distant heart sounds, no clicks, no rubs, no murmurs.  ABDOMEN:  Obese, positive bowel sounds, normal in frequency and pitch.  No bruits, no rebound, no guarding, no midline pulsatile mass, no  organomegaly.  SKIN:  No rash, no nodules.  EXTREMITIES:  2+ pulse, no edema.  NEURO:  Grossly intact.   EKG sinus bradycardia rate 54, first-degree AV block, RSR prime V1 and  V2, no acute ST-T wave changes.   ASSESSMENT AND PLAN:  1. Aortic stenosis.  The patient is doing well status post      replacement.  No further cardiovascular testing is suggested.  He      understands and agrees with his prophylaxis.  2. Dyspnea, this is much improved.  He is followed with Dr. Ortencia Miles.  No      further therapy is warranted.  3. Carotid stenosis.  The patient does have some mild carotid      stenosis.  We will follow this with scheduled Doppler next year.  4. Atrial flutter, he has had no further symptoms status post      ablation.  5. Dyslipidemia, he is followed by Dr. Artist Miles.  6. Followup.  I will plan to see him back in May prior to him possibly      taking his first trip to [place].     Wesley Rotunda, MD, [facility]   Electronically Signed    [name]  DD: 06/10/2008  DT: 06/11/2008  Job #: [id number]   cc:   Wesley Hair. Wesley Pais, DO

## 2010-12-22 NOTE — Assessment & Plan Note (Signed)
[facility]                            CARDIOLOGY OFFICE NOTE   [name], [name]                 MRN:          [id number]  DATE:05/18/2007                            DOB:          [birth year]    PRIMARY:  Is Dr. Thomos Lemons.   REASON FOR PRESENTATION:  Evaluate patient with aortic valve  replacement.   HISTORY OF PRESENT ILLNESS:  The patient returns for one-year follow up.  He has done quite well since I last saw him. He has had no palpitations  suggestive of recurrent atrial flutter. He has had no chest discomfort,  neck or arm discomfort. He has had no shortness of breath. He denies any  PND or orthopnea. He stays active.   PAST MEDICAL HISTORY:  1. Aortic stenosis (status post aortic valve replacement, #21-mm      Edwards pericardial valve, model 2700 by Dr. Donata Clay April 28, 2007).  2. Well-preserved ejection fraction.  3. Nonobstructive coronary disease.  4. Sleep apnea (the patient stopped using CPAP).  5. Carotid stenosis (0-30% bilateral internal carotid stenosis and      chronic right external carotid artery occlusion).  6. Pulmonary embolism in 1994.  7. Prostate cancer.  8. Hiatal hernia.  9. Chronic obstructive pulmonary disease.  10.Degenerative joint disease.  11.Left inguinal hernia repair.  12.Tonsillectomy.  13.Left hip replacement.   ALLERGIES:  None.   MEDICATIONS:  1. Furosemide 40 mg daily.  2. Vitamin C.  3. Ambien.  4. Advair.  5. Prilosec 20 mg daily.  6. Levoxyl 125 mcg daily.  7. Caltrate.  8. Spiriva.  9. Fish oil.  10.Aspirin 81 mg daily.  11.Klor-con 20 mEq daily.   REVIEW OF SYSTEMS:  As stated in the HPI and otherwise negative for  other symptoms.   PHYSICAL EXAMINATION:  The patient is in no distress. Blood pressure  115/72, heart rate 59 and regular, weight 193 pounds, body mass index  24.  HEENT:  Eyelids unremarkable; pupils are equal, round, and reactive to  light; fundi  not visualized. Oral mucosal unremarkable.  NECK:  No jugular venous distention at 45 degrees. Carotid upstrokes  brisk and symmetrical. No bruits. No thyromegaly.  LYMPHATICS:  No cervical, axillary, or inguinal adenopathy.  LUNGS:  Clear to auscultation bilaterally.  BACK:  No costovertebral angle tenderness.  CHEST:  Unremarkable.  HEART:  PMI not displaced or sustained. S1 and S2 within normal limits.  No S3, no S4, no clicks, no rubs, 2/6 apical systolic murmur radiating  slightly out of the aortic outflow track, no diastolic murmurs.  ABDOMEN:  Flat, positive bowel sounds, normal in frequency and pitch, no  bruits, no rebound, no guarding, no midline pulsatile mass, no  hepatomegaly, no splenomegaly.  SKIN:  No rashes, no nodules.  EXTREMITIES:  2+ peripheral pulses, 1+ dorsalis pedis and posterior  tibialis bilaterally, trace bilateral lower extremity edema.  NEUROLOGICAL:  Oriented to person, place, and time; cranial nerves II-  XII grossly intact; motor grossly intact.   EKG:  Sinus bradycardia, rate 58, axis within normal limits, intervals  within normal  limits, no acute ST-T wave changes.   ASSESSMENT AND PLAN:  1. Aortic stenosis. The patient is now status post aortic valve      replacement. He has done quite well with this. No further      cardiovascular testing is suggested. He understands endocarditis      prophylaxis.  2. Carotid stenosis. The patient is due to have this checked again in      May of next year. He should be in the call-back list.  3. Atrial flutter. The patient is not having any further dysarthrias      status post ablation. No further testing is warranted.  4. Follow up. I will see the patient back in one year or sooner if      needed.     Rollene Rotunda, MD, [facility]  Electronically Signed    [name]  DD: 05/18/2007  DT: 05/19/2007  Job #: [id number]   cc:   Barbette Hair. Artist Pais, DO

## 2010-12-25 NOTE — Procedures (Signed)
[facility]  Patient:    [name], [name]                     MRN: [id number] Proc. Date: 05/25/00 Adm. Date:  [id number] Attending:  Louie Bun CC:         Monica Becton, M.D.   Procedure Report  PROCEDURE:  Colonoscopy with polypectomy.  INDICATION FOR PROCEDURE:  Patient referred for screening colonoscopy at age 77.  DESCRIPTION OF PROCEDURE:  The patient was placed in the left lateral decubitus position and placed on the pulse monitor with continuous low-flow oxygen delivered by nasal cannula.  He was sedated with 40 mg IV Demerol and 5 mg IV Versed.  The Olympus video colonoscope was inserted into the rectum and advanced to the cecum, confirmed by transillumination at McBurneys point and visualization of the ileocecal valve and appendiceal orifice.  The prep was excellent.  On the base of the cecum was seen a 6 mm polyp, which was fulgurated by hot biopsy.  The remainder of the cecum, ascending, and transverse colon appeared normal with no further polyps, masses, diverticula, or other mucosal abnormalities.  Within the descending colon was seen an 8 mm sessile polyp, which was fulgurated by hot biopsy.  The remainder of the descending and sigmoid colon appeared normal.  No diverticula were seen. Within the rectum at approximately 12 cm from the anal verge was an 8 mm sessile polyp, which was fulgurated by hot biopsy.  The remainder of the rectum appeared normal.  There were some external hemorrhoids seen but no obviously enlarged internal hemorrhoids on the retroflexed view.  The colonoscope was then withdrawn and the patient returned to the recovery room in stable condition.  He tolerated the procedure well, and There were no immediate complications.  IMPRESSION: 1. Three small colon polyps. 2. External hemorrhoids.  PLAN:  Await histology to determine the need for future colon screening. DD:  05/25/00 TD:  05/26/00 Job:  [id number]

## 2010-12-25 NOTE — Cardiovascular Report (Signed)
NAMEMONTI, JILEK NO.:  [id number]   MEDICAL RECORD NO.:  [id number]                   PATIENT TYPE:  OIB   LOCATION:  6501                                 FACILITY:  [facility]   PHYSICIAN:  Carole Binning, M.D. [facility]         DATE OF BIRTH:  07/27/1921   DATE OF PROCEDURE:  04/21/2004  DATE OF DISCHARGE:                              CARDIAC CATHETERIZATION   PROCEDURE PERFORMED:  Right and left heart catheterization with coronary  angiography, left ventriculography, and aortic angiography.   INDICATION:  Mr. Ashmead is an 77 year old male with a history of aortic  stenosis.  Recent evaluation by echocardiogram revealed a degree of aortic  stenosis that is severe.  He does note symptomatically to have a limited  exercise tolerance.  He was in need of lower back surgery.  However, it was  felt that Dr. Antoine Poche that because of the severity of his aortic stenosis  he should be referred for aortic valve replacement prior to his back  surgery.  He was therefore brought to the catheterization laboratory today  for right and left heart catheterization to assess his hemodynamics and  coronary anatomy.   CATHETERIZATION PROCEDURAL NOTE:  A 7 French sheath was placed in the right  femoral vein, 5 French sheath in the right femoral artery.  Right heart  catheterization was performed with a Swan-Ganz catheter.  Left  ventriculography and aortic root angiography were performed with an angled  pigtail catheter.  It was initially noted that the descending thoracic aorta  was in the right side of the chest.  It was not clear where the aortic arch  was.  We therefore performed a thoracic aortogram.  This revealed  significant ectasia of the thoracic aorta.  However, normal aortic arch.  We  then were able to advance our pigtail into the ascending aorta and  successfully across the aortic valve.  We performed left ventriculography  and aortic root angiography with  a 4 French angled pigtail catheter.  We  also measured left ventricular pressures with this angled pigtail and  measured aortic valve gradient on catheter pullback as well as simultaneous  recording of left ventricular and femoral artery pressures.  Coronary  angiography was performed with 5 [place] JL-5 and JR-4 catheters.  Contrast  was Omnipaque.  There were no complications.   CATHETERIZATION RESULTS:   HEMODYNAMICS:  1.  Right atrial pressure:  A 8, V 6, mean 4.  2.  Right ventricular pressure:  34/6.  3.  Pulmonary artery pressure:  32/12.  4.  Pulmonary capillary wedge mean pressure 10.  5.  Left ventricular pressure:  200/20.  6.  Aortic pressure:  162/80.  7.  The cardiac output by thermodilution method was 3.6, cardiac index 1.7.  8.  Cardiac output by the Fick method is 3.0, cardiac index 1.4.  9.  The mean aortic valve gradient on catheter pullback was 29 mmHg.  10. Utilizing  the thermodilution derived cardiac output,  the calculated      aortic valve area was 0.8 sq cm.  11. Utilizing the [facility] derived cardiac output, the calculated aortic valve      area is 0.07 sq cm.   LEFT VENTRICULOGRAM:  Wall motion is normal.  Ejection fraction is estimated  at 60%.  There is 1-2+ mitral regurgitation which appears to be likely  secondary to ventricular ectopy.   Aortic root angiography reveals a tricuspid aortic valve with calcification  of the valve, decreased mobility.  There was 1+ mild aortic insufficiency.  There is generalized dilatation of the ascending aorta with significant  ectasia of the thoracic aorta.   CORONARY ARTERIOGRAPHY (RIGHT DOMINANT):  Left main is normal.   Left anterior descending artery has minor luminal irregularities in the mid  vessel.  The LAD gives rise to a normal size first diagonal and small second  diagonal branch.   Left circumflex gives rise to a normal size ramus intermedius, small first  obtuse marginal and a normal size bifurcating  second obtuse marginal branch.  There are minor luminal irregularities in the distal circumflex.   Right coronary artery is a dominant vessel.  It gives rise to a large acute  marginal which supplies a distal portion of the inferior septum.  There is a  small posterior descending artery and a small posterior lateral branch.   IMPRESSION:  1.  Normal right and left heart filling pressures with only mildly elevated      pulmonary artery systolic pressure.  2.  Normal left ventricular systolic function.  However, there is a decrease      cardiac output.  3.  Severe aortic stenosis.  4.  No significant coronary artery disease identified.                                               Carole Binning, M.D. [facility]    MWP/MEDQ  D:  04/21/2004  T:  04/21/2004  Job:  [id number]   cc:   Ernestina Penna, M.D.  [address] [place]  [place] [address]  Fax: [phone]   Rollene Rotunda, M.D.   Mikey Bussing, M.D.  [address]  [place]  [place] [address]

## 2010-12-25 NOTE — Consult Note (Signed)
[name], [name] NO.:  [id number]   MEDICAL RECORD NO.:  [id number]                   PATIENT TYPE:  OIB   LOCATION:  6501                                 FACILITY:  [facility]   PHYSICIAN:  Mikey Bussing, M.D.           DATE OF BIRTH:  Feb 10, 1921   DATE OF CONSULTATION:  04/21/2004  DATE OF DISCHARGE:                                   CONSULTATION   CONSULTING PHYSICIAN:  Kerin Perna, M.D.   REQUESTING PHYSICIAN:  Rollene Rotunda, M.D.   PRIMARY CARE PHYSICIAN:  Ernestina Penna, M.D.   REASON FOR CONSULTATION:  Aortic stenosis.   CHIEF COMPLAINT:  Shortness of breath.   HISTORY OF PRESENT ILLNESS:  I was asked to evaluate this 77 year old white  male for potential aortic valve replacement for recently diagnosed severe  aortic stenosis.  The patient has had a long history of cardiac murmur.  He  has been followed by serial 2D echoes which have demonstrated apparently  moderate aortic stenosis.  He recently was evaluated by Dr. Phoebe Perch for a  complex lumbar back reconstruction for degenerative arthritis and pain with  walking.  As part of his preoperative evaluation, a followup echo was  performed which showed progression in the aortic stenosis.  For that reason,  he underwent cardiac catheterization today.  The patient has had progressive decrease exercise tolerance and shortness of  breath with minimal exertion.  He denies resting symptoms of orthopnea or  PND.  He denies chest pain.  He denies a history of previous myocardial  infarction, or cardiac arrhythmia, or syncope.  The patient states he had a  normal childhood without a history of rheumatic fever.  He states he does  get antibiotic prophylaxis prior to dental procedures.  He was last seen by  his dentist approximately three weeks ago.  Cardiac catheterization, by Dr. Gerri Spore, demonstrated no significant  coronary artery disease.  His ejection fraction was normal at 60%.   Left  ventricular end diastolic pressure was 20-mmHg.  P pressures were 32/14.  The cardiac output was approximately 4 liters per minute.  Mean  transvalvular gradient of the stenotic aortic valve was 34-mmHg.  The  calculated aortic valve area was 0.7 to 0.8-cm2.  Because of the patient's  severe aortic stenosis, he was felt to be a candidate for aortic valve  replacement prior to undergoing complex lumbar back reconstructive surgery.   PAST MEDICAL HISTORY:  1.  Severe aortic stenosis with mild aortic insufficiency.  2.  Severe lumbar degenerative arthritis.  3.  Deconditioning due to a low functional level from his arthritis.  4.  Mild carotid artery disease bilaterally with 0-40% stenosis.  5.  History of prostate cancer, status post prostatectomy with followup      radiation, current PSA level less than 1.0.  6.  COPD.  7.  History of pulmonary embolism in 1994.  8.  Hiatal hernia/GERD.  9.  No known drug allergies.  10. Sleep apnea on home CPAP.   CURRENT MEDICATIONS:  1.  Lasix 40 mg every day.  2.  Levoxyl 25 mcg p.o. every day.  3.  Aspirin 81 mg p.o. every day.  4.  Zocor 40 mg p.o. every day.  5.  Atrovent inhaler b.i.d.  6.  Mobic 7.5 mg every day.  7.  Lunesta 2 mg q.h.s.  8.  Multivitamins.   SOCIAL HISTORY:  The patient is a retired P[employer] consultant.  He is divorced and  lives with his girlfriend.  He stopped smoking 12 years ago, after smoking  two packs daily.  He denies significant alcohol intake.   FAMILY HISTORY:  Negative for family cardiac valvular disease.   REVIEW OF SYSTEMS:  His weight has been steady.  He denies any recent sweats  or fever.  HEENT:  Negative for change in vision or difficulty swallowing.  He has had total dental extraction with an upper plate and partial plate on  the lower teeth.  PULMONARY:  Negative for thoracic trauma, productive  cough, or a history of abnormal chest x-ray.  CARDIAC:  Positive for his  cardiac murmur and aortic  stenosis.  Negative for a history of MI.  GI:  Positive for mild GERD.  Negative for jaundice, blood per rectum, or  hepatitis.  UROLOGIC:  Positive for prostate cancer.  Negative for kidney  stones.  VASCULAR:  Negative for DVT, claudication, or TIA.  NEUROLOGIC:  Negative for seizure or stroke.  ENDOCRINE:  Positive for thyroid disease.  Negative for diabetes.   PHYSICAL EXAMINATION:  VITAL SIGNS:  The patient is 5' 10 and weighs 215  pounds, blood pressure 138/60, heart rate is 60 and sinus, respirations 18,  saturation 95% on room air.  HEENT:  Normocephalic.  Full EOMs.  Pharynx clear.  Dentition adequate with  an upper dental plate and partial lower plate.  NECK:  Without JVD, mass, or carotid bruit.  LYMPHATICS:  Reveal no palpable cervical or supraclavicular adenopathy.  LUNGS:  Clear.  CARDIAC:  Reveals a grade 3/6 systolic ejection murmur radiating to the  right upper sternal border.  There is no S3, or S4 gallop, or murmur of  aortic insufficiency.  ABDOMEN:  Soft, obese, nontender without organomegaly.  SKIN:  Without rash or lesion.  EXTREMITIES:  Reveal no clubbing, cyanosis, or edema.  Peripheral pulses are  2+ in all extremities.  NEUROLOGIC:  Alert and oriented x 3 without focal motor deficit.   LABORATORY DATA:  His EKG shows a partial right bundle branch block pattern  without ST segment ischemic changes in a sinus rhythm.   His carotid Duplex studies performed last week demonstrate mild bilateral  carotid stenosis with a 0-39% bilateral internal carotid artery disease.   IMPRESSION:  1.  Severe aortic stenosis with a calculated aortic valve area of 0.7 to      0.8.  2.  History of prostate cancer.  3.  Chronic obstructive pulmonary disease.  4.  Sleep apnea on home CPAP.  5.  Degenerative arthritis of his back requiring extensive lumbar      reconstruction.  IMPRESSION/PLAN:  The patient will be cannulated for aortic valve  replacement with a valve  prosthetic tissue valve.  Approximately three  months after his valve replacement surgery would be required before he would  be able to undergo his back operation.  He is interested in having his back  surgery performed as soon as  possible.  The patient will be re-examined and  a date of surgery should be possible within the next 7-10 days.                                               Mikey Bussing, M.D.    PV/MEDQ  D:  04/21/2004  T:  04/21/2004  Job:  [id number]   cc:   [facility] Office

## 2010-12-25 NOTE — Discharge Summary (Signed)
[name], [name]            ACCOUNT NO.:  [id number]   MEDICAL RECORD NO.:  [id number]          PATIENT TYPE:  INP   LOCATION:  2030                         FACILITY:  [facility]   PHYSICIAN:  Kerin Perna, M.D.  DATE OF BIRTH:  09-11-1920   DATE OF ADMISSION:  04/27/2004  DATE OF DISCHARGE:  05/03/2004                                 DISCHARGE SUMMARY   ADMISSION DIAGNOSES:  Severe aortic valve stenosis with a class III to IV of  congestive heart failure.   PAST MEDICAL HISTORY:  1.  Aortic valve stenosis followed with serial echocardiograms.  2.  Chronic obstructive pulmonary disease.  3.  Sleep apnea--uses continuous positive airway pressure at home.  4.  Status post pulmonary embolus 1994.  5.  History of prostate cancer.  6.  Hiatal hernia.  7.  Degenerative disk disease.   The patient is not allergic to any medications.   DISCHARGE DIAGNOSES:  Severe aortic valve stenosis with class III to IV  congestive heart failure status post aortic valve replacement.   BRIEF HISTORY:  [name] is an 77 year old Caucasian man.  He  presented to his cardiologist's office, Dr. Antoine Poche, for preoperative  clearance prior to proceeding with back surgery.  Because of his known  aortic valve stenosis and symptoms of dyspnea and decreased exercise  tolerance, Dr. Antoine Poche recommended proceeding with an echocardiogram.  This  revealed his aortic stenosis to have progressed to a severe degree.  He was  then referred to Dr. Kathlee Nations Trigt for consideration of aortic valve  replacement.  Dr. Donata Clay felt the patient would benefit from aortic valve  replacement but before proceeding recommended further cardiac workup with  catheterization.  Catheterization was performed on September 13th by Dr.  Gerri Spore.  This demonstrated an aortic valve area of 0.7 to 0.8 with  transvalvular mean gradient of 32 mmHg.  There was mild aortic  insufficiency.  There was no significant coronary  artery disease and  pulmonary artery pressures of 32/14.  [name] returned to the [facility]  office on September 14th for further discussion with Dr. Donata Clay.  Dr. Donata Clay agreed that proceeding with aortic valve replacement was the preferred  treatment of choice for this gentleman.  The procedure risks and benefits  were discussed and [name] agreed to proceed with surgery.   HOSPITAL COURSE:  On April 27, 2004, [name] was electively  admitted to [facility] under the care of Dr. Kathlee Nations Trigt.  He  underwent the following surgical procedure:  Aortic valve replacement with a  21 mm pericardial valve [facility] #[id number], serial [id number]).  The patient  tolerated this procedure well, transferring in stable condition to the SICU.  He remained hemodynamically stable in the immediate postoperative period and  was extubated several hours after arrival on the intensive care unit. He  awoke from anesthesia neurologically intact. He developed a mild ileus  postoperatively.  This responded to some treatment with Reglan.  Also  because of his history of PE, he was started on Lovenox low-dose therapy.  Dr. Donata Clay had  no plans to start Coumadin as long as the patient remained  in normal sinus rhythm.  [name] progressed sufficiently to be  transferred to unit 2000 on postoperative day #2.   The remainder of his postoperative course had been uneventful.  He is making  very good progress in recovery from his surgery.   September 25th was postoperative day #6, on morning rounds [name]  reports feeling very well.  He is anxious to be discharged home.  His vital  signs are stable with blood pressure 158/78.  He is afebrile and his room  air saturation is 96%.  His heart is maintaining normal sinus rhythm 82  beats per minute.  His lungs are clear to auscultation.  He is tolerating  his diet without nausea.  His bowel and bladder functions are within  normal  limits for him.  His incision is healing very well.  His bilateral lower  extremities remain with some edema and he is still approximately 5 pounds  over his preoperative weight.  He is ambulating independently.  His pain is  adequately controlled.  [name] is ready for discharge home today,  May 03, 2004.   Recent laboratory studies September 22nd CBC:  White blood cells 9.2,  hemoglobin 10, hematocrit 28.6, platelets 115.  September 23rd chemistries  included sodium 137, potassium 3.9, BUN 47, creatinine 1.5, glucose 113.   CONDITION ON DISCHARGE:  Improved.   INSTRUCTIONS ON DISCHARGE:  1.  Medications:      1.  Enteric-coated aspirin 325 mg daily.      2.  Toprol-XL 25 mg daily.      3.  Zocor 40 mg daily.      4.  Levoxyl 25 mcg daily.      5.  Lasix 40 mg daily.      6.  Potassium 20 mEq daily.      7.  Combivent inhaler 2 puffs b.i.d.      8.  Spiriva 18 mcg daily.          1.  Advair Diskus 100/50 mcg one puff b.i.d.      9.  Prilosec OTC daily.      10. For pain management he may have Darvocet-N 100 one to two p.o. q.4          h. for moderate to severe pain or Tylenol 325 mg one to two p.o. q.4          h. for mild pain.  2.  Activity:  He has been asked to refrain from any driving or heavy      lifting, pushing, or pulling.  Also instructed to continue his breathing      exercises and daily walking.  3.  His diet should continue to be a low fat, low salt diet.  4.  Wound care:  He is to shower daily with mild soap and water.  If his      incision shows any sign of infection he is to call Dr. Zenaida Niece Trigt's      office.  5.  Follow up:      1.  He is to see Dr. Antoine Poche back in his office in approximately 2          weeks and has been asked to arrange that appointment.  He will have          a chest x-ray that day.      2.  [name] has an appointment  to see Dr. Donata Clay back at the         [facility] on Friday, October 14th, at 11:15 in the  morning.  He will be          asked to bring his chest x-ray from Dr. Jenene Slicker office for Dr.          Donata Clay to review.       CTK/MEDQ  D:  05/03/2004  T:  05/04/2004  Job:  [id number]   cc:   Rollene Rotunda, M.D.   Dr. Tommi Emery, [facility]   Ernestina Penna, M.D.  [address] [place]  [place] [address]  Fax: [phone]

## 2010-12-25 NOTE — Discharge Summary (Signed)
[name], [name] NO.:  [id number]   MEDICAL RECORD NO.:  [id number]          PATIENT TYPE:  IPS   LOCATION:  4009                         FACILITY:  [facility]   PHYSICIAN:  Ellwood Dense, M.D.   DATE OF BIRTH:  [birth year]   DATE OF ADMISSION:  DATE OF DISCHARGE:  08/27/2004                                 DISCHARGE SUMMARY   DISCHARGE DIAGNOSES:  1.  L2-L5 decompression lumbar laminectomy with fusion and pedicle screw      secondary to spinal stenosis and myelopathy.  2.  History of obstructive sleep apnea.  3.  Atrial flutter status post cardioversion.  4.  History of chronic obstructive pulmonary disease.  5.  History of congestive heart failure.  6.  History of hyperlipidemia.  7.  History of aortic stenosis.  8.  History of aortic valve replacement.  9.  Slightly elevated liver function tests.   HISTORY AND PHYSICAL:  The patient is an 77 year old white male with history  of aortic stenosis, AVR and low back pain radiating to the lower  extremities, admitted on August 13, 2004 for L2-L5 decompression lumbar  laminectomy/L2-L5 fusion, pedicle screw on January 5 by Dr. Phoebe Perch secondary  to stenosis.  No DVT prophylaxis at that time.   Postoperative complications included atrial flutter status post  cardioversion, per cardiology.   PT report at that time indicates patient is transfer, total assist,  ambulates 60 feet, total assist.  Hospital course also significant for  confusion.  The patient was transferred to [facility] Department on  August 17, 2004.   REVIEW OF SYSTEMS:  Significant for shortness of breath, lumbago, weakness  and numbness.   PAST MEDICAL HISTORY:  Significant for COPD, OSA, prostate cancer, GERD,  hyperlipidemia, aortic stenosis, hypothyroidism, PE, AVR.   PAST MEDICAL HISTORY:  Aortic valve replacement, hernia repair, prostate  surgery.   FAMILY HISTORY:  Noncontributory.   SOCIAL HISTORY:  Patient lives  alone in 1-level home with 2 steps to entry.  Daughter works day shift.  Niece is M.S.W. here on rehab.  Retired Pensions consultant.  Occasional alcohol.   MEDICATIONS PRIOR TO ADMISSION:  1.  Lasix 40 mg daily.  2.  Spiriva daily.  3.  Advair daily.  4.  Protonix.  5.  Zocor 40 mg daily.  6.  Aspirin daily.   HOSPITAL COURSE:  Mr. [name] was admitted to [facility]  Rehabilitation Department on August 17, 2004 for comprehensive in-patient  rehab. Received more than 3 hours of therapy daily.  Overall, Mr. [name]  progressed very well, had no specific back pain, numbness or tingling in his  arm, able to don back brace.  Modified independent, patient able to perform  most ADL's, modified supervision level. At time of discharge, he was able to  transfer, modified assist level, bed to mobility modified assist level. He  was able to ambulate 200 feet, mod assist level with rolling walker.  Patient had met all goals.  Able to tolerate therapies every well. He  received education about back precautions and how to perform them correctly.  He remained on Lovenox  with DVT prophylaxis throughout his stay in rehab  without any complications noted.   The patient continued to take Robaxin as needed for pain as well as Ultram  and Vicodin.  The patient remained on Advair as well as Spiriva for history  of COPD.  No adjustments in these medications were really necessary.  The  patient continued to take Toprol, Lasix for history of hypertension.  He  also remained on Trinsicon 1 tab p.o. b.i.d. for anemia.  Latest hemoglobin  performed on August 18, 2004 was 10.5, hematocrit 38.7.  Recommend follow-  up with primary care physician regarding this.   The patient did have slightly elevated LFT's, latest performed on August 21, 2004 was 86 ALT and alkaline phosphatase was 145.  Recommend follow-up  with primary care physician regarding this.  Possibly, could hold Zocor in  the future if ALT  continues to be elevated.  The patient did receive  potassium supplement while in rehab due to Lasix.  The patient had no  significant shortness of breath or cough  while on rehab.  Besides  hyperkalemia and anemia, no other major issues occurred while on rehab.   LATEST LABORATORY DATA:  Sodium 135, potassium 3.5, chloride 103, carbon  dioxide 26, glucose 147, BUN 12, creatinine 1.0, AST 86, alkaline  phosphatase 145. Hemoglobin 10.5, hematocrit 30.7, white blood cell count  5.8, platelet count 221.  Urine culture performed on August 17, 2004, no  growth x1 day.   EXAMINATION ON DISCHARGE:  Site of incision well-healed, demonstrated no  signs of infection.  The patient was discharged overall modified independent  level.  He did have a blister on his great toe, stable intact.   There were no other major issues that occurred in rehab at time of  discharge.  The patient was discharged to home to his family.   DISCHARGE MEDICATIONS:  1.  Lasix 40 mg daily.  2.  Resume Spiriva.  3.  Resume Advair daily.  4.  Prilosec 20 mg daily.  5.  Levoxyl 125 mg daily.  6.  Aspirin 81 mg daily.  7.  Claritin 10 mg daily.  8.  Metoprolol 50 mg daily.  9.  Zocor 40 mg daily.  10. Trinsicon 1 tablet twice daily.  11. K-Dur 20 mEq daily.  12. Robaxin 5 mg 1-2 tablets every 6-8 hours as needed.  13. Vicodin 1-2 tablets every 4-6 hours as needed.   DISCHARGE INSTRUCTIONS:  The patient is to wear a back brace when sitting,  standing and walking, observe back precautions. No alcohol or tobacco, no  driving.  Use walker when ambulatory. [facility] for PT/OT.   FOLLOW UP:  Follow with Dr. Phoebe Perch in 2 weeks, call for appointment. Follow  up with Dr. Rudi Heap in 4 weeks to monitor liver function tests,  possibly to hold Lipitor in the future if needed. Follow-up with hemoglobin  as well. Follow-up with Dr. Rollene Rotunda to monitor heart rate, presently stable on metoprolol 50 mg daily,  status post cardiac conversion.  Follow-up  Dr. Ellwood Dense as needed.       LB/MEDQ  D:  08/27/2004  T:  08/27/2004  Job:  [id number]

## 2010-12-25 NOTE — Assessment & Plan Note (Signed)
[facility]                              CARDIOLOGY OFFICE NOTE   MOROCCO, GIPE                 MRN:          [id number]  DATE:05/18/2006                            DOB:          Nov 21, 1920    The primary is Barbette Hair. Artist Pais, DO   REASON FOR PRESENTATION:  Evaluate patient status post aortic valve  replacement.   HISTORY OF PRESENT ILLNESS:  The patient is returning for his 60-month follow-  up.  He has done well since I last saw him.  He has had atrial flutter  ablation and has had no recurrent tachypalpitations.  He is being followed  by Dr. Artist Pais now as a new patient.  He denies any chest discomfort, arm  discomfort, arm discomfort, activity-induced nausea or vomiting, excessive  diaphoresis.  He has no palpitations, presyncope or syncope.  He has had no  PND or orthopnea.  He tries to exercise daily.   PAST MEDICAL HISTORY:  1. Aortic stenosis, status post aortic valve replacement (#21 mm Edwards      pericardial valve, model 2700, by Dr. Donata Clay April 27, 2004),      and a well-preserved ejection fraction.  2. Nonobstructive coronary disease.  3. Sleep apnea (the patient stopped using CPAP).  4. Carotid stenosis, 0-30% bilateral internal carotid stenosis and chronic      right external carotid artery occlusion.  5. Pulmonary embolism in 1994.  6. Prostate cancer.  7. Hiatal hernia.  8. Chronic obstructive pulmonary disease.  9. Degenerative joint disease.  10.Left inguinal hernia repair.  11.Tonsillectomy.  12.Left hip replacement.   ALLERGIES:  None.   MEDICATIONS:  1. Furosemide 40 mg a day.  2. Vitamin C.  3. Ambien.  4. Advair.  5. Prilosec.  6. Levoxyl 125 mcg daily.  7. Caltrate.  8. Glucosamine.  9. Spiriva.  10.Klor-Con.  11.Fish oil.  12.Salmon.  13.Aspirin 81 mg daily.   REVIEW OF SYSTEMS:  Positive for dyspnea with exertion that has been  chronic.   PHYSICAL EXAMINATION:  GENERAL:  The patient  is well-appearing and in no  acute distress.  VITAL SIGNS:  Blood pressure 122/74, heart rate 62 and regular, weight 200  pounds.  HEENT:  Eyelids unremarkable.  Pupils equal, round, and reactive to light.  Fundi not visualized.  Oral mucosa unremarkable.  NECK:  No jugular venous distention, wave form within normal limits.  Carotid upstroke brisk and symmetric, slight bruit.  No thyromegaly.  LYMPHATIC:  No adenopathy.  LUNGS:  Clear to auscultation bilaterally.  BACK:  No costovertebral angle tenderness.  CHEST WALL:  Well-healed sternotomy scar.  HEART:  PMI not displaced or sustained.  S1 and S2 within normal limits, no  S3, no S4, a 2/6 systolic murmur heard best at the upper outflow tract and  early peaking, no diastolic murmurs.  ABDOMEN:  Obese, positive bowel sounds, normal in frequency and pitch, no  rebound, no bruits, no rebound, no guarding or midline pulsatile mass, no  organomegaly.  SKIN:  No rashes, no nodules.  EXTREMITIES:  2+ pulses, no edema.  NEUROLOGIC:  Grossly intact.  EKG:  Sinus rhythm, rate 64, axis within normal limits, intervals within  normal limits.  No acute ST-T wave changes.   ASSESSMENT AND PLAN:  1. Aortic valve replacement.  The patient is doing well with respect to      this.  He understands the new endocarditis prophylaxis guidelines.  No      further cardiovascular testing is suggested.  2. Atrial flutter.  This is now ablated and he is maintaining sinus      rhythm.  No further evaluation is warranted.  He is now off Coumadin.  3. Carotid stenosis.  He is due to have this followed up again in two      years.  This is mild.  4. Follow-up.  I will see him again in one year or sooner if needed.            ______________________________  Rollene Rotunda, MD, [facility]   [name]  DD:  05/18/2006  DT:  05/20/2006  Job #:  [id number]   cc:   Barbette Hair. Artist Pais, DO

## 2010-12-25 NOTE — Consult Note (Signed)
[name], PLAIN NO.:  [id number]   MEDICAL RECORD NO.:  [id number]          PATIENT TYPE:  INP   LOCATION:  3103                         FACILITY:  [facility]   PHYSICIAN:  Arvilla Meres, M.D. [facility] OF BIRTH:  Sep 28, 1920   DATE OF CONSULTATION:  08/15/2004  DATE OF DISCHARGE:                                   CONSULTATION   PRIMARY CARE PHYSICIAN:  Ernestina Penna, M.D.   CARDIOLOGIST:  Rollene Rotunda, M.D.   CONSULTING SERVICE:  Neurosurgical service.   HISTORY OF PRESENT ILLNESS:  [name] is a delightful 77 year old male  with a history of aortic stenosis, status post AVR in September of 2005, who  is now postoperative day #2 of extensive lumbar laminectomy.  His  postoperative course has been complicated by atrial flutter with rapid  ventricular response and we have been consulted to help with management of  that.   [name] has a long history of mild to moderate aortic stenosis.  However, he had a preoperative evaluation for back surgery in September of  2005, which showed progressive AS.  He subsequently underwent cardiac  catheterization on April 21, 2004, which showed severe AS with aortic  valve area of approximately 0.7 cm/sq and a decreased cardiac index at 1.4 L  per minute per m/sq.  At the time he also had a normal ejection fraction and  normal coronary arteries.  He then underwent aortic valve replacement with a  tissue valve on April 27, 2004, by Dr. Kathlee Nations Trigt.  He did well  postoperatively with no mention of postoperative atrial fibrillation or  flutter.   After his AVR he continued to have low back pain with neurogenic  claudication and severe difficulty walking.  He was admitted on August 13, 2004, for a lumbar laminectomy, which was apparently very technically  difficult surgery.  Postoperatively he was doing well; however, at 4 a.m.  this morning he developed atrial flutter with heart rates in the  130-140  range.  He was asymptomatic from this point of view.  He did have a  significant amount of back pain which is much improved.  Currently his main  complaint is persistent nausea and inability to hold down p.o.'s.   PAST MEDICAL HISTORY:  1.  Severe aortic stenosis.      1.  Status post tissue AVR in September 2005 by Dr. Kathlee Nations Trigt.      2.  Preoperative cardiac catheterization showed normal left ventricular          ejection fraction with no coronary artery disease.  2.  Severe low back pain with neurogenic claudication and degenerative disk      disease, status post lumbar laminectomy on January 6.  3.  COPD.  4.  Obstructive sleep apnea on C-PAP.  5.  History of prostate cancer, status post resection in 1994, postoperative      course complicated by pulmonary embolus.  6.  Gastroesophageal reflux disease.  7.  Hyperlipidemia.   CURRENT MEDICATIONS:  1.  Lasix 40 a day.  2.  Combivent inhaler.  3.  Simvastatin  40 a day.  4.  Spiriva.  5.  Advair.  6.  Protonix.   ALLERGIES:  He has no known drug allergies.   SOCIAL HISTORY:  He is a retired [employer] consultant.  He is widowed.  He has a history  of tobacco but quit a long time ago.  Just social alcohol.   FAMILY HISTORY:  Noncontributory.   REVIEW OF SYSTEMS:  As per chart.   PHYSICAL EXAMINATION:  GENERAL:  He is lying flat in bed and he is mildly  short of breath, but otherwise no acute distress.  VITAL SIGNS:  Temperature is 98.1, blood pressure is 118/61, heart rate is  115-125.  He is saturating 93% on 5 L.  HEENT:  Sclerae anicteric.  EOMI.  NECK:  Supple.  JVP is hard to assess, given his body habitus.  His carotids  are 2+ bilaterally without any bruits.  CARDIAC:  He is tachycardic and regular with a 2/6 systolic ejection murmur  at the left sternal border.  There is no rub or gallop.  LUNGS:  Have mild rhonchi with decreased air movement anteriorly.  ABDOMEN:  Distended but nontender.  Decreased bowel  sounds.  EXTREMITIES:  Warm with no edema.  He has good distal pulses.  There is no  cyanosis.  NEUROLOGIC:  He is alert and oriented x3.   LABORATORY DATA:  Lab values show white count of 8.5, hematocrit of 29.9,  and glucose of 162.  Sodium is 136, potassium is 3.8, chloride is 104,  bicarb is 24, BUN is 20, creatinine is 1.2, glucose is 153, magnesium is 2.   ECG from this morning shows atrial flutter with a ventricular response of  139.  There is very mild ST depression.   ASSESSMENT/PLAN:  [name] has postoperative atrial flutter.  This is  likely a stress rhythm, as he is not currently a candidate for  anticoagulation and may not be for several days.  I gave him one dose of  amiodarone 150 mg IV in an attempt to convert him back to sinus rhythm, but  this was  unsuccessful.  Thus we will proceed with rate control attempts  with IV Diltiazem.  If his heart rate appears difficult to control we will  consider electrical cardioversion in the morning, as he will be less than 48  hours after his initiation of his arrhythmia.  The alteernative plan will be  to let him recover and consider TEE-guided cardioversion when he can be  anticoagulated fully.  We will check an echocardiogram and a TSH and will  follow.  We appreciate the consult, and please do not hesitate to call us  with any questions.      Dani   DB/MEDQ  D:  08/15/2004  T:  08/15/2004  Job:  [id number]

## 2010-12-25 NOTE — Op Note (Signed)
NAMEGLADSTONE, ROSAS NO.:  [id number]   MEDICAL RECORD NO.:  [id number]                   PATIENT TYPE:  INP   LOCATION:  2301                                 FACILITY:  [facility]   PHYSICIAN:  Burna Forts, M.D.             DATE OF BIRTH:  11-16-1920   DATE OF PROCEDURE:  DATE OF DISCHARGE:                                 OPERATIVE REPORT   PROCEDURE:  Intraoperative transesophageal echocardiogram.   ANESTHESIOLOGIST:  Burna Forts, M.D.   INDICATIONS FOR PROCEDURE:  The patient is an 77 year old gentleman who  presents today for an aortic valvular replacement, to be performed by Dr.  Kathlee Nations Trigt.   DESCRIPTION OF PROCEDURE:  He is brought to the holding area on the morning  of surgery where under local anesthesia with sedation, the pulmonary artery  catheter and radial arterial lines are inserted.  He is then taken to the  operating room for a routine induction of general anesthesia.  The trachea  is intubated.  The TEE probe is then lubricated and passed oropharyngeally  into the stomach and then withdrawn slightly for imaging of the cardiac  structures.   PRE-CARDIOPULMONARY BYPASS TRANSESOPHAGEAL ECHOCARDIOGRAM EXAMINATION:  LEFT  VENTRICLE:  The left ventricular chamber is a concentrically hypertrophied  chamber, not significantly so, but definitely hypertrophied.  Both short and  long axis views demonstrate thickened walls, both anteroinferiorly and in  the long axis view in the anterior wall.  Both appear to be contracting  appropriately with good systolic contractile patterns noted in both short  and long axis views.  The papillary muscles are well-outlined and seen  within the ventricle.  There are no other masses noted.  MITRAL VALVE:  The mitral valve is found to be thickened in the mitral valve  apparatus.  There does appear to be calcium in both the anterior and the  posterior leaflets, at the point where they insert into  the annular area,  such that there is almost a ring of calcium to be noted.  Overall, the  mitral valve appears to open appropriately during the diastolic inflow, and  close satisfactorily during systolic ejection.  Doppler examination in both  short and several views, indicated that there is just trace of mitral  regurgitant flow noted.  Left Atrium:  The left atrium is a normal chamber.  No masses are noted  within.  The intra-atrial septum is intact.  AORTIC VALVE:  The aortic valve is noted to be a heavily calcified  apparatus.  The calcium is so abundant, that it is difficult to tell whether  this is a three-leaflet or a two-leaflet valve.  It appears to be three  leaflet, but the calcium marking causes such a loss of imaging, that some  parts could not actually be visualized.  There does appear to be significant  restriction to motion in the valve, due to the heavy calcium noted.  As  previously done by TEE, this was noted to be an aortic valve area of about  0.7 to 0.85 sq/cm.  Doppler examination in a long axis view indicates 3+  aortic stenotic jets noted above the aortic valve, with 1 to 1.5+ aortic  insufficiency jets noted in the left ventricular outflow track.  Again, both  long and short axis views are obtained, indicating primarily heavy calcium  consistent with significant aortic stenosis.  RIGHT VENTRICLE:  The right ventricle, tricuspid valve and right atrium are  essentially within normal limits.  The patient is placed on cardiopulmonary bypass.  An aortotomy is performed.  Replacement of the diseased aortic valve with a #21 pericardial tissue valve  was performed by Dr.Van Trigt.  The deairing maneuvers are carried out.  The  patient is prepared for separation from cardiopulmonary bypass.  POST-CARDIOPULMONARY BYPASS TRANSESOPHAGEAL ECHOCARDIOGRAM EXAMINATION  (LIMITED EXAM):  LEFT VENTRICLE:  Early in the bypass period this is a paced chamber, but  there is good  overall contractility consistent with the pre-bypass period.  AORTIC VALVE:  In place of the disease, heavily calcified valve can now be  seen.  The markings of the pericardial tissue valve, its both struts and  edges of the leaflets can be seen.  The leaflets are moving well, opening  appropriately during systolic ejection, and closing appropriately during  diastole.  There is no obstruction to flow, and therefore no stenosis.  There is no aortic insufficiency noted across the leaflets or in the  perivalvular area.  This is consistent with an entirely satisfactory repair  of the aortic valve and replacement.      JTM/MEDQ  D:  04/27/2004  T:  04/28/2004  Job:  [id number]   cc:   Anesthesia Department - [facility]

## 2010-12-25 NOTE — Op Note (Signed)
NAMEKEEN, EWALT            ACCOUNT NO.:  [id number]   MEDICAL RECORD NO.:  [id number]          PATIENT TYPE:  INP   LOCATION:  2890                         FACILITY:  [facility]   PHYSICIAN:  Elana Alm. Thurston Hole, M.D. DATE OF BIRTH:  October 21, 1920   DATE OF PROCEDURE:  01/26/2005  DATE OF DISCHARGE:                                 OPERATIVE REPORT   PREOPERATIVE DIAGNOSIS:  Left hip degenerative joint disease.   POSTOPERATIVE DIAGNOSIS:  Left hip degenerative joint disease.   PROCEDURE:  Left cemented total hip replacement using DePuy total hip system  with 58 mm press-fit Sector cup with one locking screw and 10 degree x +4 x  36 mm polyethylene liner.  Femoral component #5 cemented high offset Summit  stem with +5 x 36 mm femoral head with #3 cement plug and 11 mm centralizer.   SURGEON:  Salvatore Marvel, M.D.   ASSISTANT:  Julien Girt, P.A.   ANESTHESIA:  General.   OPERATIVE TIME:  1 hour 20 minutes.   ESTIMATED BLOOD LOSS:  250 mL.   COMPLICATIONS:  None.   DESCRIPTION OF PROCEDURE:  Mr. Presley was brought to the operating room  on January 26, 2005, placed on the operative table in the supine position.  After an adequate level of general anesthesia was obtained, he had a Foley  catheter placed under sterile conditions.  He received Ancef 1 g IV  preoperatively for prophylaxis.  His left hip was examined.  He had flexion  to 90, extension to 0, internal and external rotation of 20 degrees with  approximately 1.5 inches of shortening in the left compared to the right.  After being placed under general anesthesia, he was turned in the left  lateral decubitus position and secured on the bed with a Mark frame.  His  left hip and leg was prepped using sterile DuraPrep and drape using sterile  technique.  Originally, through a 15 cm posterolateral greater trochanteric  incision initial exposure was made.  Then the subcutaneous tissues were  incised along with skin  incision.  The iliotibial band and gluteus maximus  fascia was incised longitudinally, revealing the underlying sciatic nerve  which was carefully protected.  The short external rotators of the hip and  hip capsule were carefully released off the femoral neck insertions and  tagged and then the hip was posteriorly dislocated.  He was found to have  grade 3 and 4 DJD changes throughout the femoral head.  A femoral neck cut  was made 1.5 cm above the lesser trochanter in the appropriate amount of  anteversion and declination.  Sequential carefully placed retractors were  placed around the acetabulum.  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  Degenerative labrum was removed around the  edges of the acetabulum and then sequential acetabular reaming was carried  out to a 57 mm size in the appropriate amount of abduction, anteversion and  declination.  A 58 mm trial cup was then placed, found to give an excellent  fit.  It was then removed and the actual 58 mm Sector cup  was placed in the  appropriate amount of anteversion and declination and then further secured  in place with one locking screw in the 12 o'clock position.  This gave a  very firm and tight fit.  A 36 mm x +4 mm 10 degree lip liner was placed  with the 10 degree lip in the posterolateral position.  Then the femur was  exposed, sequential axial reamers were used to ream the femur up to a #5  size followed by broaches to a #5 size and with the #5 broach in place and  the +5 x 36 mm femoral head trial, the hip was reduced, taken through a full  range of motion, found to be stable up to 70 degrees of internal rotation in  both neutral and 30 degrees of adduction and also stable on abduction and  external rotation and leg lengths were found to be equalized.  At this  point, then the femoral trial was removed, cement plug was measured, #3 was  found to be appropriate size, this was then placed.  The femoral  canal was  then jet lavage irrigated with 3 liters of saline solution and then filled  with cement and then a #5 high offset stem with an 11 mm centralizer was  placed down the femoral canal with excess cement being removed from around  the edges.  After the cement hardened, a +5 x 36 mm femoral head was  hammered onto the femoral neck with an excellent morse taper fit.  The hip  was then reduced, taken through a full range of motion, found to be stable  and leg lengths equal.  At this point, it was felt that all components were  of excellent size, fit and stability.  The wound was further irrigated with  saline and then short external rotators of the hip and hip capsule were  reattached to their femoral neck insertion through two drill holes in the  greater trochanter.  The iliotibial band and gluteus maximus fascia was  closed with #1 Ethibond sutures.  Subcutaneous tissues closed with 0 and 2-0  Vicryl.  Skin closed with skin staples.  Sterile dressings were applied, a  hip abduction pillow applied.  The patient turned supine, checked for leg  lengths that were equal, rotation was equal.  Pulse was 2+ and symmetric.  He was then awakened, extubated and taken to the recovery room in stable  condition.  Needle and sponge count was correct x2 at the end of the case.       RAW/MEDQ  D:  01/26/2005  T:  01/26/2005  Job:  [id number]

## 2010-12-25 NOTE — Assessment & Plan Note (Signed)
[facility]                           ELECTROPHYSIOLOGY OFFICE NOTE   RANEY, KOEPPEN                 MRN:          [id number]  DATE:02/25/2006                            DOB:          Apr 26, 1921    Mr. Manville returns today for follow up.  He is a very pleasant elderly  male with a history of atrial flutter who underwent electrophysiology study  and catheter ablation several weeks ago.  He returns today for follow up.  He has done well and denies chest pain or shortness of breath or  palpitations.   On exam, he is a pleasant, well-appearing, elderly man in no distress.  The  blood pressure today was 137/75, the pulse 57 and regular, respirations were  18, the weight was 198 pounds.  NECK:  Revealed no jugular venous distention.  LUNGS:  Clear bilaterally to auscultation.  CARDIOVASCULAR EXAM:  Revealed a regular rate and rhythm with a normal S1  and S2.  EXTREMITIES:  Demonstrated no edema.   EKG demonstrates sinus rhythm with a normal axis and intervals.   IMPRESSION:  1.  Atrial flutter.  2.  Chronic Coumadin therapy.  3.  Status post aortic valve replacement.   DISCUSSION:  Overall, Mr. Mcpartlin is stable.  His heart has maintained  sinus rhythm very nicely.  I have instructed him that it would be reasonable  for him to go ahead and discontinue his Coumadin at the present time, as he  has had successful catheter ablation and a pericardial valve in place.                                   Doylene Canning. Ladona Ridgel, MD   GWT/MedQ  DD:  02/25/2006  DT:  02/25/2006  Job #:  [id number]

## 2010-12-25 NOTE — Op Note (Signed)
NAMEELLINGTON, GREENSLADE NO.:  [id number]   MEDICAL RECORD NO.:  [id number]          PATIENT TYPE:  INP   LOCATION:  2899                         FACILITY:  [facility]   PHYSICIAN:  Clydene Fake, M.D.  DATE OF BIRTH:  May 16, 1921   DATE OF PROCEDURE:  08/13/2004  DATE OF DISCHARGE:                                 OPERATIVE REPORT   PREOPERATIVE DIAGNOSES:  Lumbar stenosis, spondylosis and degenerative  disease and spondylolisthesis.   POSTOPERATIVE DIAGNOSES:  Lumbar stenosis, spondylosis and degenerative  disease and spondylolisthesis.   PROCEDURE:  L2-L5 decompressive laminectomy, (three levels), L2-5  posterolateral fusion with autograft and BMP, segmental pedicle screw  fixation with Expedium system L2-L5.   REASON FOR PROCEDURE:  The patient is an 78 year old gentleman with back and  leg trouble.  He has had problems walking.  He has neurogenic claudication  and no workup.  X-rays and MRI show severe stenosis including lateral recess  stenosis, disk disease and severe spondylytic changes in the lumbar spine  with some spinal stenosis at L2-3 and L3-4.  The patient is brought in for  decompression and fusion of the lumbar spine.   PROCEDURE IN DETAIL:  The patient is brought into the operating room and  general anesthesia was induced.  The patient was placed in the appropriate  position in the Wilson frame with all pressure points padded.  The patient  was prepped and draped in a sterile fashion.  A site of incision was  injected with 20 mL of 1% lidocaine with epinephrine.  The incision was then  made in the midline in the lumbar spine.  The incision was taken down the  fascia and hemostasis was obtained with Bovie cauterization.  The fascia was  incised and subperiosteal dissection was done of the lumbar spine over the  spinous processes up to the facets bilaterally.  Lateral fluoroscopic  imaging was obtained to confirm our positioning.  We then continued  the  decompression taking subperiosteal dissection from L1-2 and L2-5 down the  facets and then going out laterally and dissecting the transverse processes  of L2, L3, L4 and L5 bilaterally.  Self-retaining retractors were placed as  we got this equation.  Fluoroscopic images were again obtained, confirming  our positioning.  A decompressive laminectomy was then done removing the  bottom three-quarters of L2, all of L3-4 and the top of L5 with [employer] and Kerrison punches.  Facetectomies were performed bilaterally at  L2-3, L3-4 and L4-5 levels.  A high-speed drill was also used in the  decompression along with the Kerrison punches.  The ligamentum flavum was  removed to the center and then as we worked out lateral, the lateral  decompression was obtained all the way up to the pedicles bilaterally.  There was near compression in the lateral recesses and foraminotomies were  done over the L2, L3, and L4 and L5 nerve roots bilaterally to decompress  the nerve roots.  Once we had a good decompression, all the bone that was  removed during the laminectomy was cleaned from soft tissue, chopped up into  small  pieces and kept to use later in the case.  BMP was then prepared which  was then infused.  The transverse process and lateral facets were then  decorticated with a high-speed drill bilaterally and pedicle entry points  were found using intraoperative landmarks plus fluoroscopy.  We decorticated  the entry point with the high speed drill, placed a pedicle probe down the  pedicle, probed it with a small bone probe, making sure we had bony edges  all the way around, tapped the hole, checked it again with the small bone  probe and then placed the pedicle screw, 6 x 50 mm Expedium screws were used  at all pedicles bilaterally.  We started at L2 and placed first in the left  and then the right a screws and then continued this process at L3, L4 and  L5.  Final AP and lateral fluoroscopic  images were obtained when we had all  the screws in ensuring they were in good position.  The infuse were then  placed in the posterolateral gutters.  The posterolateral gutters were then  packed with the allograft bone on top of the infuse bilaterally for an L2 to  L5 fusion.  Rods were placed into the screw heads and then locking nuts were  placed over the rod and tightened down.  These were final tightened.  Two  cross connectors were then placed across the rods and tightened in place.  The retractors were removed.  Hemostasis was obtained with bipolar and Bovie  cauterization.  The paraspinal muscles were closed with 0 Vicryl interrupted  suture.  The fascia closed with 0 Vicryl interrupted suture.  The  subcutaneous tissue was closed with 0, 2-0 and 3-0 Vicryl interrupted suture  and the skin closed with Benzoin and Steri-Strips.  Dressings were placed.  The patient was placed back to a supine position, awoken from anesthesia and  transferred to the recovery room in stable condition.       JRH/MEDQ  D:  08/13/2004  T:  08/13/2004  Job:  [id number]

## 2010-12-25 NOTE — Op Note (Signed)
NAMEMARKICE, TORBERT          ACCOUNT NO.:  [id number]   MEDICAL RECORD NO.:  [id number]          PATIENT TYPE:  OIB   LOCATION:  NA                           FACILITY:  [facility]   PHYSICIAN:  Doylene Canning. Ladona Ridgel, M.D.  DATE OF BIRTH:  Sep 05, 1920   DATE OF PROCEDURE:  01/24/2006  DATE OF DISCHARGE:                                 OPERATIVE REPORT   PROCEDURE PERFORMED:  Electrophysiologic study and RF catheter ablation of  atrial flutter.   INTRODUCTION:  The patient is a very pleasant 77 year old male with a  history of aortic valve replacement and a history of atrial flutter who is  now referred for electrophysiologic study and catheter ablation.   PROCEDURE:  After informed consent was obtained, the patient was taken to  the diagnostic EP lab in the fasting state.  After usual preparation and  draping, intravenous fentanyl and midazolam was given for sedation.  A 6-  [place] hexapolar catheter was inserted percutaneously into the right jugular  vein and advanced to the coronary sinus.  A 7-French 24 halo catheter was  inserted percutaneously in the right femoral vein and advanced to the right  atrium.  A 5-French quadripolar catheter was inserted percutaneously in the  right femoral vein and advanced the His bundle region.  Measurement of basic  intervals and mapping was carried out, demonstrating typical  counterclockwise atrial flutter cycle length of 250 milliseconds.  A 7-  French quadripolar ablation catheter was then advanced into the right  atrium, and the ablation catheter was placed in the tricuspid valve annulus  area.  A total of three RF energy applications were delivered to this region  resulting in termination of atrial flutter and restoration of sinus rhythm  and creation of bidirectional isthmus block.  The patient was observed, and  during this time, rapid ventricular pacing was carried out the RV apex and  demonstrated VA dissociation at 600 milliseconds.   Programmed ventricular  stimulation was carried out, also demonstrating VA dissociation.  Programmed  atrial stimulation was then carried out from the coronary sinus and the high  right atrium, base drive cycle length of 161 milliseconds.  The S1 and S2  interval was stepwise decreased from 440 milliseconds, down to 340  milliseconds, where AV node ERP was observed.  During programmed atrial  stimulation, there were no AH jumps, no echo beats and no inducible SVT.  Next, rapid atrial pacing was carried out from the coronary sinus as well as  the high right atrium, base drive cycle length of 096 milliseconds and  stepwise decreased down to 390 milliseconds where AV Wenckebach was  observed.  During rapid atrial pacing, the PR interval was less than the RR  interval, and there was no inducible SVT.  AV Wenckebach cycle length was  390 milliseconds.  At this point, programmed atrial stimulation was carried  out from the coronary sinus and high right atrium, base drive cycle length  of 045 milliseconds.  The S1 and S2 interval was stepwise decreased down to  340 milliseconds where the AV node ERP was observed.  During probing  stimulation there  no AH jumps and no echo beats.  At this point, the patient  was observed for 30 minutes.  There was no inducible SVT, and mapping  demonstrated persistent atrial flutter isthmus block.  The catheter was then  removed, and hemostasis was assured and the patient was returned to his room  in satisfactory condition.   COMPLICATIONS:  There were no immediate procedure complications.   RESULTS:  1.  Baseline ECG.  The ECG demonstrates atrial flutter with a cycle length      of 250 milliseconds and a variable AV conduction.  The HV interval was      47 milliseconds.  Following return of sinus rhythm, the sinus node cycle      length was 1020 milliseconds, the HV interval 54 milliseconds and the      QRS duration was 94 milliseconds.  2.  Rapid ventricular  pacing.  Rapid ventricular pacing was carried out      following ablation demonstrating VA dissociation at 600 milliseconds.  3.  Programmed ventricular stimulation.  Programmed ventricular stimulation      was carried out from the RV apex at base drive cycle length of 161      milliseconds.  This demonstrated VA dissociation.  4.  Rapid atrial pacing.  Rapid atrial pacing was carried out from the      coronary sinus as well as the high right atrium, base cycle length of      600 milliseconds and stepwise decreased down to 390 milliseconds where      AV Wenckebach was observed.  During rapid atrial pacing, the PR interval      was less than the RR interval, and there was no inducible SVT.  5.  Programmed atrial stimulation.  Programmed atrial stimulation was      carried out from the coronary sinus and the high right atrium, base      drive cycle of 096 milliseconds.  The S1 and S2 interval was stepwise      decreased down to 340 milliseconds where AV node ERP was observed.      During probing stimulation, there no AH jumps and no echo beats noted.  6.  Arrhythmias observed.  Atrial flutter.  Initiation present at time of EP      study.  Duration was sustained.  Termination was catheter ablation,      cycle length of 250 milliseconds.  7.  Mapping.  Mapping of atrial flutter isthmus demonstrated the usual size      and orientation.  8.  RF energy application.  A total of 3 RF energy applications were      delivered.  During the first RF energy application, atrial flutter was      terminated and sinus rhythm restored and isthmus block demonstrated.      Two bonus RF energy applications were then delivered.  The patient was      observed for approximately 35 minutes at which time there was no atrial      flutter isthmus conduction.   CONCLUSION:  This study demonstrates successful electrophysiologic study and  RF catheter ablation of typical atrial flutter with a total of 3 RF  energy applications delivered at usual atrial flutter isthmus resulting termination  of atrial flutter, restoration of sinus rhythm, creation of bidirectional  block and atrial flutter isthmus.           ______________________________  Doylene Canning. Ladona Ridgel, M.D.     GWT/MEDQ  D:  01/24/2006  T:  01/25/2006  Job:  [id number]   cc:   Rollene Rotunda, M.D.  [address]  [address]  [place]  [place] [address]   Thomos Lemons, D.O. [facility]  [address] [place], [place] [address]

## 2010-12-25 NOTE — H&P (Signed)
NAMEJUNIUS, [name]            ACCOUNT NO.:  [id number]   MEDICAL RECORD NO.:  [id number]          PATIENT TYPE:  INP   LOCATION:  2899                         FACILITY:  [facility]   PHYSICIAN:  Clydene Fake, M.D.  DATE OF BIRTH:  April 09, 1921   DATE OF ADMISSION:  08/13/2004  DATE OF DISCHARGE:                                HISTORY & PHYSICAL   CHIEF COMPLAINT:  Back and leg pain with trouble walking.   HISTORY:  The patient is an 77 year old gentleman who has had a couple year  progressive trouble walking with neurogenic claudication and work up showed  lumbar stenosis with spondylolisthesis, severe degenerative changes.  I  scheduled the patient for back surgery.  He saw his cardiologist.  He knew  he had an aortic valve problem, and it was recommended for an aortic valve  replacement that was done earlier, a few months ago.  He is now cleared for  lumbar surgery and for a decompression of his lumbar spine with fusion.   PAST MEDICAL HISTORY:  Significant for aortic stenosis, acid reflux,  prostate cancer.   PAST SURGICAL HISTORY:  His surgeries include aortic valve replacement in  September of 2005, hernia repair in the 1990s, prostate surgery in the  1990s, complicated by a pulmonary embolus.  T&A as a child.  __________ in  2005.   MEDICATIONS:  1.  Furosemide.  2.  Zocor.  3.  Levoxyl.  4.  Lunesta.  5.  Spiriva.  6.  Advair.  7.  Prilosec.  8.  Aspirin.  9.  Advil.  10. Ambien.  11. Claritin.   SOCIAL HISTORY:  He is a retired Pensions consultant, is a widower, does not smoke.  Does have a 40 pack year history, uses alcohol socially.   REVIEW OF SYSTEMS/FAMILY HISTORY:  Noncontributory.   ALLERGIES:  No known drug allergies.   PHYSICAL EXAMINATION:  HEENT:  Unremarkable.  NECK:  Supple.  LUNGS:  Clear.  HEART:  A regular rate and rhythm.  ABDOMEN:  Soft and nontender.  EXTREMITIES:  Intact.  No edema.  He has a positive straight leg raise on  the left.  Negative  on the right.  Motor strength shows 5/5 strength and all  motors.  Sensation is intact.  Gait demonstrates pain in the left leg and  somewhat bending at the waist.   ASSESSMENT AND PLAN:  A patient with lumbar stenosis, spondylolisthesis and  spondylitic changes.  The patient will be admitted for a decompression and  fusion.       JRH/MEDQ  D:  08/13/2004  T:  08/13/2004  Job:  [id number]

## 2010-12-25 NOTE — Discharge Summary (Signed)
[name], TROOP          ACCOUNT NO.:  [id number]   MEDICAL RECORD NO.:  [id number]          PATIENT TYPE:  OIB   LOCATION:  3703                         FACILITY:  [facility]   PHYSICIAN:  Doylene Canning. Ladona Ridgel, M.D.  DATE OF BIRTH:  1920/10/18   DATE OF ADMISSION:  01/24/2006  DATE OF DISCHARGE:  01/24/2006                                 DISCHARGE SUMMARY   ALLERGIES:  No known drug allergies.   This preparation and exam took 30 minutes.   PRINCIPAL DIAGNOSES:  1.  Asymptomatic atrial flutter.  The patient is having no chest pain, no      palpitation, no pre/syncope.  2.  Discharged after electrophysiology study, radiofrequency catheter      ablation.  (A)  Typical counterclockwise atrial flutter.  1.  Coumadin therapy for atrial flutter to be decided by Dr. Ladona Ridgel as an      outpatient.   SECONDARY DIAGNOSES:  1.  History of congestive heart failure in a setting of severe aortic      stenosis.  (A)  Status post implant of 21 mm Edwards pericardial valve in September  2005.  1.  Catheterization in September 2005.  Ejection fraction 60%.  All      coronaries with minor luminal irregularities.  2.  Echocardiogram in January 2006 with ejection fraction 55% to 65%.  Left      ventricular function is normal.  3.  History of obstructive sleep apnea with intermittent use of CPAP.  4.  History of pulmonary embolism in 1994.  5.  History of prostate cancer, status post prostatectomy in October 1994.  6.  Chronic obstructive pulmonary disease.  Remote tobacco use.  7.  Status post spinal decompression back surgery in 2006.  8.  Status post left total hip arthroplasty.  9.  Hiatal hernia.  10. Left inguinal herniorrhaphy.  11. Insomnia.   PROCEDURE:  January 24, 2006, electrophysiology study, radiofrequency catheter  ablation of typical counterclockwise atrial flutter with creation of  bidirectional cavotricuspid isthmus block.   BRIEF HISTORY:  Mr. 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  He has a  history of aortic valve disease.  He is status post aortic valve replacement  with pericardial tissue valve in September 2005.   He has a history of chronic obstructive pulmonary disease.  He has had back  problems in the past with spinal decompression surgery recently in 2006.   The patient has been found to have atrial flutter on routine physical  examinations.  He is not having any symptoms related to this.  He is  referred for additional evaluation.  He does not have chest pain or  shortness of breath.  He does note he has trouble sleeping and often gets up  and exercises in the middle of the night.  He finds that this actually helps  him get back to sleep.  He has not had any palpitations.  Treatment options  include electrophysiology study with radiofrequency catheter ablation.  The  risks, benefits and goals of this procedure have been explained to the  patient and his daughter and they wish to proceed.  HOSPITAL COURSE:  The patient presented January 24, 2006, electively to [facility].  He underwent electrophysiology study with radiofrequency  catheter ablation the same day with ablation of a typical counterclockwise  atrial flutter.  The patient has had no postprocedural complications,  including the right groin which is without hematoma.  He is maintaining  sinus rhythm.  His mental status is clear.  He is eating well.  His vital  signs are stable.  He will be discharged the same day, June 18.   DISCHARGE MEDICATIONS:  1.  Aspirin 81 mg daily.  He is to continue this.  2.  Claritin 10 mg daily.  3.  Furosemide 40 mg daily.  4.  Combivent 2 puffs in the morning and 2 puffs in the evening.  5.  Zocor 40 mg daily at bedtime.  6.  Levoxyl 125 mcg daily.  7.  Lunesta 2 mg daily at bedtime.  8.  Spiriva 18 mcg 1 inhalation daily.  9.  Prilosec 20 mg daily.  10. Vitamin C 500 mg daily.  11. Coumadin 5 mg daily or as directed by the Coumadin  clinic.   DISCHARGE INSTRUCTIONS:  He is asked not to drive for the next 2 days.  He  is asked not to lift anything heavier than 10 pounds for the next 2 weeks.  He may shower.  He is to call [phone] if he experiences increased pain,  swelling or bleeding in his catheterization site.  He sees Dr. Ladona Ridgel in 4-6  weeks on Friday, July 20, at 11:45, and he will present to the Coumadin  clinic on Monday, June 25, at 10:45.  Note, if he plans dental work, he is  to be sure to obtain antibiotic coverage, which he tells me he already does  for his prosthetic valve.   LABORATORY DATA:  On admission, his PT was 25.1 and INR 2.3.  Complete blood  count June 18 showed hemoglobin 14.9, hematocrit 43.8, white cells 4.4,  platelets 176.  Serum electrolytes this admission showed sodium 141,  potassium 4.1, chloride 110, bicarbonate 25, BUN 28, creatinine 1.1, glucose  106.      [name], P.A.    ______________________________  Doylene Canning. Ladona Ridgel, M.D.    GM/MEDQ  D:  01/24/2006  T:  01/24/2006  Job:  [id number]   cc:   Thomos Lemons, D.O. [facility]  [address] [place], [place] [address]   Doylene Canning. Ladona Ridgel, M.D.  [address]  [address]  [place]  [place] [address]

## 2010-12-25 NOTE — Discharge Summary (Signed)
[name], HEIST NO.:  [id number]   MEDICAL RECORD NO.:  [id number]          PATIENT TYPE:  INP   LOCATION:  3103                         FACILITY:  [facility]   PHYSICIAN:  Clydene Fake, M.D.  DATE OF BIRTH:  1921/02/27   DATE OF ADMISSION:  08/13/2004  DATE OF DISCHARGE:  08/17/2004                                 DISCHARGE SUMMARY   DIAGNOSIS:  Lumbar stenosis, spondylosis, degenerative disease,  spondylolisthesis.   DISCHARGE DIAGNOSIS:  Lumbar stenosis, spondylosis, degenerative disease,  spondylolisthesis.   PROCEDURE:  L2-5 decompressive laminectomy with posterolateral fusion with  autograft BMP, segmental pedicle screw fixation in L2-5.   REASON FOR ADMISSION:  This is an 77 year old gentleman who has had a couple-  year history of progressive problems with walking with neurogenic  claudication.  He was found to have lumbar stenosis, spondylolisthesis, and  severe degenerative changes.  The patient is brought in for decompression  and fusion.   HOSPITAL COURSE:  The patient was admitted on the day of surgery and  underwent the procedure above without complications.  Postoperatively, the  patient was transferred to the intensive care unit.  He had some first  postoperative day some minimal pain, moves his legs well.  We started  increasing his activity and transferred him to a step-down room.  Cardiology  was called on August 15, 2004 because he was in some atrial flutter and  underwent cardioversion on August 16, 2004.  He did well with that.  The  patient continued to make some progress and moved legs well but not really  up ambulating yet.  PT/OT was consulted, and rehab was consulted.  __________ the patient has increased activity and ended up being accepted in  transfer to rehab on August 17, 2004.      JRH/MEDQ  D:  10/29/2004  T:  10/29/2004  Job:  [id number]

## 2010-12-25 NOTE — Op Note (Signed)
[name], RA NO.:  [id number]   MEDICAL RECORD NO.:  [id number]                   PATIENT TYPE:  INP   LOCATION:  2301                                 FACILITY:  [facility]   PHYSICIAN:  Kerin Perna III, M.D.           DATE OF BIRTH:  [birth year]   DATE OF PROCEDURE:  04/27/2004  DATE OF DISCHARGE:                                 OPERATIVE REPORT   PREOPERATIVE DIAGNOSIS:  Severe aortic stenosis with class 3 to 4 congestive  heart failure.   POSTOPERATIVE DIAGNOSIS:  Severe aortic stenosis with class 3 to 4  congestive heart failure.   OPERATION PERFORMED:  Aortic valve replacement with a 21 mm pericardial  valve (Edwards model #2700, serial  [id number]).   SURGEON:  Kerin Perna, M.D.   ASSISTANT:  Evelene Croon, M.D.   ANESTHESIA:  General.   ANESTHESIOLOGIST:  Burna Forts, M.D.   INDICATIONS FOR PROCEDURE:  The patient is an 77 year old male with known  aortic stenosis followed by serial echoes.  He developed a significant  increase in transvalvular gradient associated with symptoms of congestive  heart failure and presyncope.  Cardiac catheterization documented aortic  valve area was 0.7 to 0.8 without significant coronary disease.  He had  severe left ventricular hypertrophy.  He was felt to be a candidate for  aortic valve replacement.   Prior to surgery I examined the patient in the office and reviewed the 2D  echo and cardiac catheterization with the patient and his family.  I  discussed the indications and expected benefits of aortic valve replacement  for treatment of his aortic stenosis. I discussed the alternatives to  surgical therapy as well and the expected outcome of those alternative  therapies.  I reviewed with the patient the major aspects of the planned  procedure including the choice of a bioprosthetic valve that would avoid  long term Coumadin requirement, the location of the surgical incision, the  use of  general anesthesia and cardiopulmonary bypass, and the expected  postoperative hospital recovery.  I discussed with the patient the risks to  him of this operation including the risks of MI, CVA, bleeding, infection  and death.  He understood these implications for the surgery and agreed to  proceed with the operation as planned under what I felt was an informed  consent.   OPERATIVE FINDINGS:  The aortic valve was trileaflet, heavily calcified and  very thickened and stenotic.  The TEE after the valve replacement showed the  new bioprosthetic valve to be functioning well without aortic insufficiency.   DESCRIPTION OF PROCEDURE:  The patient was brought to the operating room and  placed supine on the operating table where general anesthesia was induced  under invasive hemodynamic monitoring.  The chest, abdomen and legs were  prepped with Betadine and draped as a sterile field.  A transesophageal 2-D  echo probe was placed by the anesthesiologist which  confirmed the  preoperative diagnosis of severe aortic stenosis.  A sternal incision was  made.  The sternal retractor was placed.  The pericardium was opened.  Heparin was administered and the ACT was documented as being therapeutic for  the aprotinin protocol used.  Pursestrings were placed in the ascending  aorta and right atrium and the patient was cannulated and placed on bypass.  A left ventricular vent was placed via the right superior pulmonary vein.  Cardioplegia cannulas were placed for both antegrade aortic and retrograde  coronary sinus cardioplegia.  The patient was cooled to 32 degrees.  The  aortic cross-clamp was applied.  800 mL of cold blood cardioplegia was  delivered in split doses between the antegrade aortic and retrograde  coronary sinus catheters.  There was good cardioplegic arrest and septal  temperature dropped less than 12 degrees.  Topical iced saline was used to  augment myocardial preservation.   A  transverse aortotomy was made.  The valve was inspected and found to have  three leaflets which were heavily calcified, thickened, redundant and  stenotic.  These were excised and the annulus was debrided of calcium.  The  annulus was irrigated with copious amounts of cold saline. The annulus was  sized to a 21 mm pericardial (Perimount) valve.  Fourteen subannular  pledgeted mattress sutures of 2-0 Ethibond were then placed around the  annulus.  These were then placed through the sewing ring of the valve which  had been washed and prepared per protocol.  The valve was seated and the  sutures were tied.  The valve was secure.  There was no obstruction of the  coronary ostia.  The valve was then irrigated with cold saline again and the  aortotomy was closed in two layers using a running 4-0 Prolene.  Prior to  closure of the aortotomy, air was evacuated from the left side of the heart  and the coronaries with a dose of retrograde warm blood cardioplegia and the  usual deairing maneuvers.  Aortotomy was tied closed and the aortic cross-  clamp was removed.   The heart resumed a spontaneous rhythm.  The patient was rewarmed to 37  degrees.  The cardioplegia cannulas were removed.  The vent was removed.  Temporary pacing wires were applied.  The lungs were re-expanded and the  ventilator was resumed. The patient was then weaned from bypass without  difficulty being AV sequentially paced with stable hemodynamics and good  cardiac output.  Protamine was administered without adverse reaction.  The  cannulas were removed.  The mediastinum was irrigated with warm antibiotic  irrigation.  There was adequate hemostasis after protamine administration.  The superior pericardial fat was closed with interrupted silk. The  mediastinum was drained with two chest tubes brought out through separate  incisions. The sternum was closed with interrupted steel wire.  The pectoralis fascia was closed using a  running #1 Vicryl.  Subcutaneous and  skin layers were closed using running Vicryl and sterile dressings were  applied. Total bypass time was with cross-clamp time of 62  minutes.      PV/MEDQ  D:  04/27/2004  T:  04/28/2004  Job:  [id number]   cc:   Rollene Rotunda, M.D.   [facility] office

## 2010-12-25 NOTE — Discharge Summary (Signed)
[name], [name]            ACCOUNT NO.:  [id number]   MEDICAL RECORD NO.:  [id number]          PATIENT TYPE:  INP   LOCATION:  5009                         FACILITY:  [facility]   PHYSICIAN:  Robert A. Thurston Hole, M.D. DATE OF BIRTH:  May 16, 1921   DATE OF ADMISSION:  01/26/2005  DATE OF DISCHARGE:  01/29/2005                                 DISCHARGE SUMMARY   ADMISSION DIAGNOSES:  1.  End-stage degenerative joint disease, left hip.  2.  Aortic valve replacement.  3.  Chronic obstructive pulmonary disease.  4.  Hypothyroidism.  5.  Carotid stenosis.  6.  Sleep apnea.   DISCHARGE DIAGNOSES:  1.  End-stage degenerative joint disease, left hip, status post total hip      replacement.  2.  Aortic valve replacement.  3.  Chronic obstructive pulmonary disease.  4.  Hypothyroidism.  5.  Carotid stenosis.  6.  Sleep apnea.   HISTORY OF PRESENT ILLNESS:  The patient is an 77 year old white male with a  history of end-stage DJD of his left hip. He failed conservative care  including anti-inflammatories, having to walk with a cane. Has pain at  night, pain with rest, unrelieved by medication.   PROCEDURES IN-HOUSE:  The patient underwent a left total hip replacement by  Dr. Thurston Hole on January 26, 2005.   He was admitted postoperatively for pain control, DVT prophylaxis, and  physical therapy. Blood pressure was 130/76, pulse 73.  Hemoglobin was 12.4.  INR was 1.1. He was metabolically stable. Dressing was changed. Physical  therapy was begun, weight bearing as tolerated. Pharmacy was consulted due  to his multiple medications and he was instructed which ones not to take. On  postoperative day two, vital signs were stable and he was neurovascularly  intact. Surgical wound was well approximated. His hemoglobin was 11.4. He  was medically stable. He was evaluated for a rehab position. He progressed  well with physical therapy. On postoperative day three the patient wanted to  go home  instead of to rehab. His hemoglobin was 10.4, INR was 1.4, and he  was metabolically stable. T-max was 98.4. He was discharged to home, weight  bearing as tolerated, on a regular diet.   DISCHARGE MEDICATIONS:  1.  Percocet 5/325 mg one to two q.4-6h. p.r.n. pain.  2.  Robaxin 500 mg one tablet q.4-6h. p.r.n. muscle spasm.  3.  Coumadin 5 mg one tablet daily.  4.  Lasix 40 mg one tablet daily.  5.  Aspirin 81 mg one tablet daily.  6.  Zocor 40 mg one tablet daily.  7.  Combivent inhaler b.i.d.  8.  Levoxyl 25 mcg daily.  9.  Chlor-Con 10 mEq daily.  10. Ambien CR 12.5 mg one tablet at night.  11. Spiriva inhaler once daily.  12. Advair 10/50 daily.  13. Prilosec one tablet.  14. Colace 100 mg one tablet b.i.d.  15. Metamucil three teaspoons daily.  16. Do not take cyclobenzaprine.   Encourage to follow his total hip precautions. Do not take Ginseng,  coenzymes, glucosamine, saw palmetto, gingko, Super B Complex, or MSN  natural. He  will follow up with Dr. Thurston Hole on July 3rd.      Kirstin Shepperson, P.A.      Robert A. Thurston Hole, M.D.  Electronically Signed    KS/MEDQ  D:  04/08/2005  T:  04/08/2005  Job:  [id number]

## 2011-02-03 ENCOUNTER — Telehealth (INDEPENDENT_AMBULATORY_CARE_PROVIDER_SITE_OTHER): Payer: Medicare Other | Admitting: Internal Medicine

## 2011-02-03 DIAGNOSIS — E039 Hypothyroidism, unspecified: Secondary | ICD-10-CM

## 2011-02-03 MED ORDER — LEVOTHYROXINE SODIUM 100 MCG PO TABS: 100.0000 ug | ORAL_TABLET | Freq: Every day | ORAL | Status: DC

## 2011-02-03 NOTE — Telephone Encounter (Signed)
Rx refill sent to pharmacy. 

## 2011-02-03 NOTE — Telephone Encounter (Signed)
Refill- levothyroxine tablet. Take 1 tablet by mouth every day. Qty 30. Last fill 5.26.12

## 2011-02-08 ENCOUNTER — Telehealth: Payer: Self-pay | Admitting: Internal Medicine

## 2011-02-08 DIAGNOSIS — E785 Hyperlipidemia, unspecified: Secondary | ICD-10-CM

## 2011-02-08 MED ORDER — SIMVASTATIN 10 MG PO TABS: 10.0000 mg | ORAL_TABLET | Freq: Every day | ORAL | Status: DC

## 2011-02-08 NOTE — Telephone Encounter (Signed)
Pt states that he accidentally threw out new bottle of simvastatin that he had refilled. Pt would like another rx sent to [facility] on [address].

## 2011-02-08 NOTE — Telephone Encounter (Signed)
Rx refill sent to pharmacy. 

## 2011-02-16 ENCOUNTER — Ambulatory Visit: Payer: Medicare Other | Admitting: Family

## 2011-02-16 DIAGNOSIS — Z0289 Encounter for other administrative examinations: Secondary | ICD-10-CM

## 2011-02-22 ENCOUNTER — Telehealth: Payer: Self-pay | Admitting: Internal Medicine

## 2011-02-22 ENCOUNTER — Encounter: Payer: Self-pay | Admitting: Family

## 2011-02-22 ENCOUNTER — Ambulatory Visit (INDEPENDENT_AMBULATORY_CARE_PROVIDER_SITE_OTHER): Payer: Medicare Other | Admitting: Family

## 2011-02-22 DIAGNOSIS — I10 Essential (primary) hypertension: Secondary | ICD-10-CM | POA: Insufficient documentation

## 2011-02-22 DIAGNOSIS — E785 Hyperlipidemia, unspecified: Secondary | ICD-10-CM

## 2011-02-22 DIAGNOSIS — E039 Hypothyroidism, unspecified: Secondary | ICD-10-CM

## 2011-02-22 DIAGNOSIS — J449 Chronic obstructive pulmonary disease, unspecified: Secondary | ICD-10-CM

## 2011-02-22 MED ORDER — ALBUTEROL SULFATE HFA 108 (90 BASE) MCG/ACT IN AERS: 2.0000 | INHALATION_SPRAY | Freq: Four times a day (QID) | RESPIRATORY_TRACT | Status: DC | PRN

## 2011-02-22 NOTE — Assessment & Plan Note (Signed)
Will check TSH

## 2011-02-22 NOTE — Assessment & Plan Note (Signed)
Patient will complete a fasting lipid panel today as well as liver function testing. Continue simvastatin.

## 2011-02-22 NOTE — Progress Notes (Signed)
Subjective:    Patient ID: [name], male    DOB: 1921-01-13, 77 y.o.   MRN: [id number]  HPI  Mr.  Miles is an 77 yr old male who presents today for folllow up.    Stoma- changing bag.  Every 2-3 days. Problems with adhering.    Patient presents today for followup of hypertension.  Patient has been treated for Chronic HTN for quiet sometime. He is currently on Furosemide, and well controlled. No associated S/S related to HTN.   Quality: chronic Modifying factor: meds Duration: Quite sometime Associated S/S: None.  The patient denies the following associated symptoms: Chest pain, dyspnea, blurred vision, headache, or lower extremity edema.  COPD- reports that he is using his inhaler twice daily.  Notes some phlegm in the evenings.  Hyperlipidemia- continues simvastin.  Denies myalgias.  Hypothyroidism-  Continues the levothyroxine for thyroid.    Review of Systems See history of present illness  Past Medical History  Diagnosis Date  . History of prostate cancer     Dr Vonita Moss s/p surgery and radiation  . CHF (congestive heart failure)     preserved EF  . COPD (chronic obstructive pulmonary disease)   . Diverticulosis of colon   . History of pulmonary embolism   . Aortic stenosis   . ILD (interstitial lung disease)   . Carotid stenosis, bilateral     30% bilateral  . Urothelial carcinoma     of the bladder    History   Social History  . Marital Status: Widowed    Spouse Name: N/A    Number of Children: 5  . Years of Education: N/A   Occupational History  . retired    Social History Main Topics  . Smoking status: Former Smoker -- 2.0 packs/day for 30 years    Types: Cigarettes  . Smokeless tobacco: Never Used   Comment: 15-20 years ago (80 pk year history)  . Alcohol Use: 0.6 - 1.2 oz/week    1-2 Glasses of wine per week  . Drug Use: No  . Sexually Active: Not on file   Other Topics Concern  . Not on file   Social History  Narrative   Retired Pensions consultant - now Tree surgeon Widowed - lives alone  4 living children   Alcohol use-yes (glass of wine per day) Former Smoker q 15-20 yrs ago (80 pk year history)         Past Surgical History  Procedure Date  . Aortic valve replacement 07/28/2007    #21 Edwards pericardial valve model 2700  . Inguinal hernia repair     left  . Tonsillectomy   . Prostate surgery     transurethral resection of prostate  . Esophagogastroduodenoscopy 05/08/2003  . Total hip arthroplasty     left hip repair  . Cystectomy 03/18/10    radical  . Robot assisted laparoscopic complete cystect ileal conduit 03/18/10    ileal conduit urinary diversion  . Colonoscopy 05/08/2003    Family History  Problem Relation Age of Onset  . Lung cancer Mother   . Cancer Mother     Lung  . Prostate cancer Father   . Cancer Father     prostate  . Tuberculosis Paternal Grandfather     patient lived with and was exposed to-wa in the [facility]    No Known Allergies  Current Outpatient Prescriptions on File Prior to Visit  Medication Sig Dispense Refill  . ALPHA-LIPOIC ACID PO Take 1  capsule by mouth daily.        . B Complex-C (B-COMPLEX WITH VITAMIN C) tablet Take 1 tablet by mouth daily.        Marland Kitchen BETA CAROTENE PO Take 1 tablet by mouth daily.        . budesonide-formoterol (SYMBICORT) 160-4.5 MCG/ACT inhaler Inhale 2 puffs into the lungs 2 (two) times daily.  1 Inhaler  1  . Calcium Carbonate (CALCARB 600) 1500 MG TABS Take 2 tablets by mouth daily.        Marland Kitchen co-enzyme Q-10 30 MG capsule Take 30 mg by mouth daily.        Marland Kitchen esomeprazole (NEXIUM) 40 MG capsule Take 1 capsule 30 minutes before morning meal.       . furosemide (LASIX) 40 MG tablet Take 1 tablet (40 mg total) by mouth daily.  30 tablet  2  . Ginkgo Biloba (GINKOBA) 40 MG TABS Take 1 tablet by mouth daily.        . Ginseng 250 MG CAPS Take 1 capsule by mouth daily.        . Glucosamine-Chondroitin (GLUCOSAMINE CHONDR COMPLEX PO)  Take by mouth.        . Lactobacillus (ACIDOPHILUS) CAPS Take by mouth.        . levothyroxine (SYNTHROID, LEVOTHROID) 100 MCG tablet Take 1 tablet (100 mcg total) by mouth daily.  30 tablet  3  . Melatonin 3 MG TABS Take by mouth.        . Selenium (SELENIMIN-200) 200 MCG TABS Take 1 tablet by mouth daily.        . simvastatin (ZOCOR) 10 MG tablet Take 1 tablet (10 mg total) by mouth daily.  30 tablet  3  . Zinc 25 MG TABS Take by mouth.          BP 126/84  Pulse 54  Temp(Src) 97.3 F (36.3 C) (Oral)  Resp 16  Ht 5\' 11"  (1.803 m)  Wt 189 lb 0.6 oz (85.748 kg)  BMI 26.37 kg/m2  SpO2 98%       Objective:   Physical Exam  Constitutional: He appears well-developed and well-nourished.  Cardiovascular: Normal rate and regular rhythm.   Pulmonary/Chest: Effort normal.        Few soft expiratory wheezes noted.good air movement to bases, no increased work of breathing.  Musculoskeletal: He exhibits no edema.  Psychiatric: He has a normal mood and affect. His behavior is normal. Judgment and thought content normal.          Assessment & Plan:  Patient was instructed to try cleansing the area beneath the adhesive on his stoma bag with rubbing alcohol prior to application of stoma bag to help the adhesive stick better.

## 2011-02-22 NOTE — Patient Instructions (Signed)
Please complete your blood work this afternoon. Follow up in 6 months, sooner if problems or concerns.  Have a nice summer!

## 2011-02-22 NOTE — Assessment & Plan Note (Signed)
He has a few wheezes today on exam, however he is asymptomatic. I have advised him to continue the same the cord twice daily and add Proventil when necessary as his rescue inhaler. He verbalizes understanding.

## 2011-02-22 NOTE — Assessment & Plan Note (Signed)
Blood pressure is stable on furosemide only. We'll continue to monitor.

## 2011-02-22 NOTE — Telephone Encounter (Signed)
Refill-furosemide 40mg  tablet. Take one tablet by mouth every day. Qty 30. Last fill 6.27.12

## 2011-02-23 LAB — HEPATIC FUNCTION PANEL
Bilirubin, Direct: 0.2 mg/dL (ref 0.0–0.3)
Indirect Bilirubin: 0.8 mg/dL (ref 0.0–0.9)

## 2011-02-23 LAB — BASIC METABOLIC PANEL WITH GFR
BUN: 31 mg/dL — ABNORMAL HIGH (ref 6–23)
Calcium: 8.9 mg/dL (ref 8.4–10.5)
GFR, Est African American: >60 mL/min (ref >60)
GFR, Est Non African American: 56 mL/min — ABNORMAL LOW (ref >60)
Glucose, Bld: 92 mg/dL (ref 70–99)
Sodium: 142 mEq/L (ref 135–145)

## 2011-02-23 LAB — LIPID PANEL
LDL Cholesterol: 61 mg/dL (ref 0–99)
VLDL: 23 mg/dL (ref 0–40)

## 2011-02-23 MED ORDER — FUROSEMIDE 40 MG PO TABS: 40.0000 mg | ORAL_TABLET | Freq: Every day | ORAL | Status: DC

## 2011-02-23 NOTE — Telephone Encounter (Signed)
Pt had appt on 02/22/11.  Follow up in 6 months.  RX sent.

## 2011-02-25 ENCOUNTER — Telehealth: Payer: Self-pay | Admitting: Family

## 2011-02-25 NOTE — Telephone Encounter (Signed)
Pls call pt and let him know that I have reviewed his lab work.  His thyroid, cholesterol and kidney function normal.  I would like him to repeat his LFT's in 1 month (slightly elevated alk Phos.)  Sometimes this can occur with gallstones.  Hopefully it will be back down to normal.  If not, we will have him do an ultrasound of his galllbladder/liver.

## 2011-02-26 NOTE — Telephone Encounter (Signed)
Pt's home number is not in service. Daughter's voicemail is full. Unable to leave message. Contact letter mailed to pt.

## 2011-03-03 ENCOUNTER — Telehealth: Payer: Self-pay | Admitting: Internal Medicine

## 2011-03-03 DIAGNOSIS — E785 Hyperlipidemia, unspecified: Secondary | ICD-10-CM

## 2011-03-03 MED ORDER — SIMVASTATIN 10 MG PO TABS: 10.0000 mg | ORAL_TABLET | Freq: Every day | ORAL | Status: DC

## 2011-03-03 NOTE — Telephone Encounter (Signed)
Refill-simvastatin 10mg  tablet. Take one tablet every day. Qty 30. Last fill 6.27.12

## 2011-03-03 NOTE — Telephone Encounter (Signed)
Rx refill sent to pharmacy. 

## 2011-06-21 ENCOUNTER — Other Ambulatory Visit: Payer: Self-pay | Admitting: *Deleted

## 2011-06-21 ENCOUNTER — Other Ambulatory Visit: Payer: Self-pay | Admitting: Internal Medicine

## 2011-06-21 MED ORDER — ESOMEPRAZOLE MAGNESIUM 40 MG PO CPDR: 40.0000 mg | DELAYED_RELEASE_CAPSULE | Freq: Every day | ORAL | Status: DC

## 2011-08-09 ENCOUNTER — Other Ambulatory Visit: Payer: Self-pay | Admitting: Internal Medicine

## 2011-08-23 ENCOUNTER — Ambulatory Visit (INDEPENDENT_AMBULATORY_CARE_PROVIDER_SITE_OTHER): Payer: Medicare Other | Admitting: Internal Medicine

## 2011-08-23 ENCOUNTER — Encounter: Payer: Self-pay | Admitting: Internal Medicine

## 2011-08-23 VITALS — BP 94/62 | HR 58 | Temp 97.8°F | Wt 191.0 lb

## 2011-08-23 DIAGNOSIS — G4733 Obstructive sleep apnea (adult) (pediatric): Secondary | ICD-10-CM

## 2011-08-23 DIAGNOSIS — J209 Acute bronchitis, unspecified: Secondary | ICD-10-CM

## 2011-08-23 MED ORDER — LEVOFLOXACIN 500 MG PO TABS: 500.0000 mg | ORAL_TABLET | Freq: Every day | ORAL | Status: AC

## 2011-08-23 NOTE — Progress Notes (Signed)
Subjective:    Patient ID: [name], male    DOB: 1920/09/16, 78 y.o.   MRN: [id number]  URI  This is a new problem. The current episode started 1 to 4 weeks ago. The problem has been gradually worsening. There has been no fever. Associated symptoms include congestion, coughing and wheezing. Pertinent negatives include no sinus pain. He has tried nothing for the symptoms.    The patient has a history of obstructive sleep apnea. He has last seen his sleep specialist 5 or 6 years ago. He has been unable to tolerate his CPAP. He has lost some weight since his original diagnosis but he has noticed that he has been feeling sleepy while driving recently.    Review of Systems  HENT: Positive for congestion.   Respiratory: Positive for cough and wheezing.        Past Medical History  Diagnosis Date  . History of prostate cancer     Dr Vonita Moss s/p surgery and radiation  . CHF (congestive heart failure)     preserved EF  . COPD (chronic obstructive pulmonary disease)   . Diverticulosis of colon   . History of pulmonary embolism   . Aortic stenosis   . ILD (interstitial lung disease)   . Carotid stenosis, bilateral     30% bilateral  . Urothelial carcinoma     of the bladder    History   Social History  . Marital Status: Widowed    Spouse Name: N/A    Number of Children: 5  . Years of Education: N/A   Occupational History  . retired    Social History Main Topics  . Smoking status: Former Smoker -- 2.0 packs/day for 30 years    Types: Cigarettes  . Smokeless tobacco: Never Used   Comment: 15-20 years ago (80 pk year history)  . Alcohol Use: 0.6 - 1.2 oz/week    1-2 Glasses of wine per week  . Drug Use: No  . Sexually Active: Not on file   Other Topics Concern  . Not on file   Social History Narrative   Retired Pensions consultant - now Tree surgeon Widowed - lives alone  4 living children   Alcohol use-yes (glass of wine per day) Former Smoker q 15-20 yrs ago (80 pk  year history)         Past Surgical History  Procedure Date  . Aortic valve replacement 07/28/2007    #21 Edwards pericardial valve model 2700  . Inguinal hernia repair     left  . Tonsillectomy   . Prostate surgery     transurethral resection of prostate  . Esophagogastroduodenoscopy 05/08/2003  . Total hip arthroplasty     left hip repair  . Cystectomy 03/18/10    radical  . Robot assisted laparoscopic complete cystect ileal conduit 03/18/10    ileal conduit urinary diversion  . Colonoscopy 05/08/2003    Family History  Problem Relation Age of Onset  . Lung cancer Mother   . Cancer Mother     Lung  . Prostate cancer Father   . Cancer Father     prostate  . Tuberculosis Paternal Grandfather     patient lived with and was exposed to-wa in the [facility] sent to [facility]    No Known Allergies  Current Outpatient Prescriptions on File Prior to Visit  Medication Sig Dispense Refill  . albuterol (PROVENTIL HFA;VENTOLIN HFA) 108 (90 BASE) MCG/ACT inhaler Inhale 2 puffs into the lungs every 6 (six) hours  as needed for wheezing.  1 Inhaler  1  . ALPHA-LIPOIC ACID PO Take 1 capsule by mouth daily.        . B Complex-C (B-COMPLEX WITH VITAMIN C) tablet Take 1 tablet by mouth daily.        Marland Kitchen BETA CAROTENE PO Take 1 tablet by mouth daily.        . budesonide-formoterol (SYMBICORT) 160-4.5 MCG/ACT inhaler Inhale 2 puffs into the lungs 2 (two) times daily.  1 Inhaler  1  . co-enzyme Q-10 30 MG capsule Take 30 mg by mouth daily.        Marland Kitchen esomeprazole (NEXIUM) 40 MG capsule Take 1 capsule (40 mg total) by mouth daily before breakfast. Take 1 capsule 30 minutes before morning meal.  30 capsule  3  . furosemide (LASIX) 40 MG tablet Take 1 tablet (40 mg total) by mouth daily.  30 tablet  5  . Ginkgo Biloba (GINKOBA) 40 MG TABS Take 1 tablet by mouth daily.        . Ginseng 250 MG CAPS Take 1 capsule by mouth daily.        . Glucosamine-Chondroitin (GLUCOSAMINE CHONDR COMPLEX PO) Take by mouth.         Marland Kitchen ibuprofen (ADVIL,MOTRIN) 200 MG tablet Take 2 tablets at bedtime as needed.       . Lactobacillus (ACIDOPHILUS) CAPS Take by mouth.        . levothyroxine (SYNTHROID, LEVOTHROID) 100 MCG tablet TAKE 1 TABLET EVERY DAY  30 tablet  3  . Melatonin 3 MG TABS Take by mouth.        . Selenium (SELENIMIN-200) 200 MCG TABS Take 1 tablet by mouth daily.        . simvastatin (ZOCOR) 10 MG tablet TAKE 1 TABLET BY MOUTH EVERY DAY  30 tablet  3  . Zinc 25 MG TABS Take by mouth.          BP 94/62  Pulse 58  Temp(Src) 97.8 F (36.6 C) (Oral)  Wt 191 lb (86.637 kg)  SpO2 97%    Objective:   Physical Exam  Constitutional: He appears well-developed and well-nourished.  HENT:  Head: Normocephalic and atraumatic.  Right Ear: External ear normal.  Mouth/Throat: Oropharynx is clear and moist.  Cardiovascular: Normal rate, regular rhythm and normal heart sounds.   Pulmonary/Chest: Effort normal.       Mild coarse breath sounds bilaterally, left greater than right  Skin: Skin is warm and dry.  Psychiatric: He has a normal mood and affect. His behavior is normal.       Assessment & Plan:

## 2011-08-23 NOTE — Patient Instructions (Signed)
Please call our office if your symptoms do not improve or gets worse.  

## 2011-08-23 NOTE — Assessment & Plan Note (Signed)
78 year old white male with history of COPD who presents with acute episode of bronchitis. Treat with Levaquin 500 milligrams once daily x10 days.  Patient advised to call office if symptoms persist or worsen.

## 2011-08-23 NOTE — Assessment & Plan Note (Addendum)
Patient has hx of OSA.  He has been unable to tolerate CPAP in the past.  He reports feeling sleepy while driving.  Refer to sleep specialist for re evaluation.

## 2011-09-07 ENCOUNTER — Other Ambulatory Visit: Payer: Self-pay | Admitting: *Deleted

## 2011-09-07 MED ORDER — LEVOTHYROXINE SODIUM 100 MCG PO TABS: 100.0000 ug | ORAL_TABLET | Freq: Every day | ORAL | Status: DC

## 2011-09-09 ENCOUNTER — Other Ambulatory Visit: Payer: Self-pay | Admitting: Family

## 2011-09-09 ENCOUNTER — Encounter: Payer: Self-pay | Admitting: Pulmonary Disease

## 2011-09-09 ENCOUNTER — Ambulatory Visit (INDEPENDENT_AMBULATORY_CARE_PROVIDER_SITE_OTHER): Payer: Medicare Other | Admitting: Pulmonary Disease

## 2011-09-09 VITALS — BP 140/80 | HR 55 | Temp 97.6°F | Wt 188.8 lb

## 2011-09-09 DIAGNOSIS — G4733 Obstructive sleep apnea (adult) (pediatric): Secondary | ICD-10-CM

## 2011-09-09 NOTE — Assessment & Plan Note (Addendum)
Given excessive daytime somnolence, narrow pharyngeal exam, witnessed apneas & loud snoring, obstructive sleep apnea is very likely & an overnight polysomnogram will be scheduled as a split study. The pathophysiology of obstructive sleep apnea , it's cardiovascular consequences & modes of treatment including CPAP were discused with the patient in detail & they evidenced understanding. Advised against medications with sedative side effects Cautioned against driving when sleepy - understanding that sleepiness will vary on a day to day basis

## 2011-09-09 NOTE — Patient Instructions (Signed)
Proceed with sleep study in the lab If needed, we will fit you & supply a CPAP machine

## 2011-09-09 NOTE — Progress Notes (Signed)
  Subjective:    Patient ID: [name], male    DOB: 01/06/1921, 78 y.o.   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 The patient has a history of obstructive sleep apnea based on a study done years ago - did not tolerate CPAP therapy then for unclear reason - poor mask fit ? He has last seen his sleep specialist 5 or 6 years ago. He has been unable to tolerate his CPAP. He has lost some weight since his original diagnosis but he has noticed that he has been feeling sleepy while driving recently.  ESS 10/24.  Bedtime 10p, latency 15 mins,  3-4 awakenings, sleeps on his rt side wit hob raises, oob at 0630, snoring +, he has lost 30 lbs in the last 2 yrs. Drinks 2 cups of coffee in am He underwent bladder resection for cancer in 2011 with ileal conduit & has to let stoma drain at night - this also interrupts his sleep..       Review of Systems Constitutional: negative for anorexia, fevers and sweats  Eyes: negative for irritation, redness and visual disturbance  Ears, nose, mouth, throat, and face: negative for earaches, epistaxis, nasal congestion and sore throat  Respiratory: negative for cough, dyspnea on exertion, sputum and wheezing  Cardiovascular: negative for chest pain, dyspnea, lower extremity edema, orthopnea, palpitations and syncope  Gastrointestinal: negative for abdominal pain, constipation, diarrhea, melena, nausea and vomiting  Genitourinary:negative for dysuria, frequency and hematuria  Hematologic/lymphatic: negative for bleeding, easy bruising and lymphadenopathy  Musculoskeletal:negative for arthralgias, muscle weakness and stiff joints  Neurological: negative for coordination problems, gait problems, headaches and weakness  Endocrine: negative for diabetic symptoms including polydipsia, polyuria and weight loss     Objective:   Physical Exam  Gen. Pleasant, well-nourished,elderly, in no  distress, normal affect ENT - no lesions, no post nasal drip Neck: No JVD, no thyromegaly, no carotid bruits Lungs: no use of accessory muscles, no dullness to percussion, clear without rales or rhonchi  Cardiovascular: Rhythm regular, heart sounds  normal, no murmurs or gallops, no peripheral edema Abdomen: soft and non-tender, no hepatosplenomegaly, BS normal. Musculoskeletal: No deformities, no cyanosis or clubbing Neuro:  alert, non focal       Assessment & Plan:

## 2011-09-09 NOTE — Progress Notes (Signed)
  Subjective:    Patient ID: [name], male    DOB: 04-13-1921, 78 y.o.   MRN: [id number]  HPI    Review of Systems  Constitutional: Negative for fever, appetite change and unexpected weight change.  HENT: Positive for congestion. Negative for ear pain, sore throat, rhinorrhea, sneezing, trouble swallowing, dental problem and postnasal drip.   Eyes: Negative for redness.  Respiratory: Negative for cough, shortness of breath and wheezing.   Cardiovascular: Negative for chest pain, palpitations and leg swelling.  Gastrointestinal: Negative for nausea, vomiting, abdominal pain and diarrhea.  Genitourinary: Negative for dysuria and urgency.  Musculoskeletal: Negative for joint swelling.  Skin: Negative for rash.  Neurological: Negative for syncope and headaches.  Hematological: Does not bruise/bleed easily.  Psychiatric/Behavioral: Negative for dysphoric mood. The patient is not nervous/anxious.        Objective:   Physical Exam        Assessment & Plan:

## 2011-09-14 ENCOUNTER — Ambulatory Visit (HOSPITAL_BASED_OUTPATIENT_CLINIC_OR_DEPARTMENT_OTHER): Payer: Medicare Other | Attending: Pulmonary Disease

## 2011-09-14 VITALS — Ht 70.0 in | Wt 190.0 lb

## 2011-09-14 DIAGNOSIS — G4733 Obstructive sleep apnea (adult) (pediatric): Secondary | ICD-10-CM | POA: Insufficient documentation

## 2011-09-14 DIAGNOSIS — J449 Chronic obstructive pulmonary disease, unspecified: Secondary | ICD-10-CM | POA: Insufficient documentation

## 2011-09-14 DIAGNOSIS — C679 Malignant neoplasm of bladder, unspecified: Secondary | ICD-10-CM | POA: Insufficient documentation

## 2011-09-14 DIAGNOSIS — J4489 Other specified chronic obstructive pulmonary disease: Secondary | ICD-10-CM | POA: Insufficient documentation

## 2011-09-14 DIAGNOSIS — R259 Unspecified abnormal involuntary movements: Secondary | ICD-10-CM | POA: Insufficient documentation

## 2011-09-19 DIAGNOSIS — C679 Malignant neoplasm of bladder, unspecified: Secondary | ICD-10-CM

## 2011-09-19 DIAGNOSIS — G4733 Obstructive sleep apnea (adult) (pediatric): Secondary | ICD-10-CM

## 2011-09-19 DIAGNOSIS — R259 Unspecified abnormal involuntary movements: Secondary | ICD-10-CM

## 2011-09-19 DIAGNOSIS — J449 Chronic obstructive pulmonary disease, unspecified: Secondary | ICD-10-CM

## 2011-09-20 ENCOUNTER — Telehealth: Payer: Self-pay | Admitting: Pulmonary Disease

## 2011-09-20 NOTE — Procedures (Signed)
NAMEJASTON, [name] NO.:  [id number]  MEDICAL RECORD NO.:  [id number]          PATIENT TYPE:  OUT  LOCATION:  SLEEP CENTER                 FACILITY:  [facility]  PHYSICIAN:  Oretha Milch, MD      DATE OF BIRTH:  1920/11/01  DATE OF STUDY:  09/14/2011                           NOCTURNAL POLYSOMNOGRAM  REFERRING PHYSICIAN:  Oretha Milch, MD  INDICATION FOR STUDY:  Mr. Deaunte is an 78 year old gentleman with COPD, excessive daytime somnolence, and recent bladder resection for urothelial carcinoma.  At the time of the study, he weighed 190 pounds, with a height of 70 inches, BMI of 27, neck size of 18.5 inches.  EPWORTH SLEEPINESS SCORE:  13.  BEDTIME MEDICATIONS:  None.  This nocturnal polysomnogram was performed with a sleep technologist in attendance.  EEG, EOG, EMG, EKG, and respiratory parameters were recorded.  Sleep stages, limb movements, arousals, and respiratory data were scored according to criteria laid out by the American Academy of Sleep Medicine.  MEDICATIONS:  SLEEP ARCHITECTURE:  Lights out was at 11:09 p.m. Lights on was at 5:14 a.m. Total sleep time was 168 minutes with a sleep period time of 339 minutes and sleep efficiency of 46%.  Sleep latency was 3 minutes. Latency to REM sleep was 55 minutes.  Sleep stages of the percentage of total sleep time was N1 14%, N2 77% (130 minutes). Slow-wave sleep was not noted. REM sleep accounted for 15 minutes (9%). Supine sleep was only noted for 4 minutes and no supine REM sleep was noted.  REM sleep was noted in 2 small stages around midnight. He had a long period of awakening from 1:30 to 3 a.m. to 4 a.m.  AROUSAL DATA:  There were total of 26 arousals with an arousal index of 9 events per hour.  Of these, 15 was spontaneous and the rest were associated with 2 desaturations and limb movements.  RESPIRATORY DATA:  There were total of 6 central apneas, 20 hypoxemia, with an apnea-hypopnea index of  9.3 events per hour.  The longest hypopnea was 69 seconds.  OXYGEN DATA:  Oxygen saturation:  The desaturation index was 36 events per hour.  The lowest desaturation was 86% during REM sleep.  He spent 18 minutes with a saturation less than 88%.  CARDIAC DATA:  The low heart rate was 30 beats per minute.  The high heart rate recorded was an artifact.  DISCUSSION:  Sleep efficiency was poor.  Slow-wave sleep was not noted. Very little supine REM sleep was noted.  Supine REM sleep was not seen. He was desensitized with an full face mask prior to the study, but did not have significant events requiring intervention.  MOVEMENT-PARASOMNIA:  There were 226 limb movements with a limb movement index of 81 events per hour.  The limb movement arousal index was only 1.4 events per hour.  IMPRESSIONS-RECOMMENDATIONS: 1. Mild obstructive sleep apnea with predominant hypoxemia causing     sleep fragmentation and oxygen desaturation.  A few central apneas     were noted related to sleep awake transition. 2. Significant limb movements were noted, however, very few of these     were related with arousals. The significance  of this is unclear. Note, that the patient gives a history of leg cramps.   3. Significant oxygen desaturation even without respiratory events     suggestive of underlying cardiopulmonary disease. 4. Few runs of PVCs up to 8-9 beats were noted. 5. No evidence of behavioral disturbance during sleep.  RECOMMENDATION: 1. Obstructive sleep apnea is very mild with predominant hypopneas and     I doubt that this requires primary treatment. 2. Oxygen desaturation can be corrected with 2 L of oxygen during     sleep. 3. Evaluation for underlying cardiopulmonary disease.  Consider     referral to cardiologist for runs of NSVT. 4. Check iron and ferritin levels and ensure ferritin above 50.     Oretha Milch, MD    RVA/MEDQ  D:  09/19/2011 14:26:37  T:  09/20/2011 06:45:13  Job:   [id number]

## 2011-09-20 NOTE — Telephone Encounter (Signed)
PSG showed drop in oxygen level during sleep If ok, would like him to sleep with 2 L O2 Pl send order & arrange OV

## 2011-09-23 ENCOUNTER — Encounter: Payer: Self-pay | Admitting: Pulmonary Disease

## 2011-09-23 NOTE — Telephone Encounter (Signed)
Pt states he does not want to start nocturnal o2 therapy until he has discussed this with RA. Pt scheduled to see RA 10/08/11

## 2011-09-23 NOTE — Telephone Encounter (Signed)
lmomtcb x1 

## 2011-10-08 ENCOUNTER — Other Ambulatory Visit (INDEPENDENT_AMBULATORY_CARE_PROVIDER_SITE_OTHER): Payer: Medicare Other

## 2011-10-08 ENCOUNTER — Ambulatory Visit (INDEPENDENT_AMBULATORY_CARE_PROVIDER_SITE_OTHER): Payer: Medicare Other | Admitting: Pulmonary Disease

## 2011-10-08 ENCOUNTER — Encounter: Payer: Self-pay | Admitting: Pulmonary Disease

## 2011-10-08 VITALS — BP 96/72 | HR 48 | Temp 97.7°F | Ht 71.0 in | Wt 197.4 lb

## 2011-10-08 DIAGNOSIS — G4761 Periodic limb movement disorder: Secondary | ICD-10-CM

## 2011-10-08 DIAGNOSIS — I359 Nonrheumatic aortic valve disorder, unspecified: Secondary | ICD-10-CM

## 2011-10-08 DIAGNOSIS — J449 Chronic obstructive pulmonary disease, unspecified: Secondary | ICD-10-CM

## 2011-10-08 DIAGNOSIS — C679 Malignant neoplasm of bladder, unspecified: Secondary | ICD-10-CM

## 2011-10-08 DIAGNOSIS — G4733 Obstructive sleep apnea (adult) (pediatric): Secondary | ICD-10-CM

## 2011-10-08 LAB — IBC PANEL: Iron: 23 ug/dL — ABNORMAL LOW (ref 42–165)

## 2011-10-08 NOTE — Patient Instructions (Signed)
Oxygen will be delivered by DME company - use this during sleep every night Check oximetry on oxygen prior to next visit Make appt with Dr Antoine Poche

## 2011-10-08 NOTE — Assessment & Plan Note (Signed)
Nocturnal desatn related to underlying cardiopulmonary disease - Start 2 L o2 during sleep

## 2011-10-08 NOTE — Assessment & Plan Note (Signed)
Runs of NSVT seen during PSG Make fu appt with dr Antoine Poche

## 2011-10-08 NOTE — Assessment & Plan Note (Addendum)
Mild , AHI 9/h, predom hypopneas Doubt this is primary cause of fatigue - hold off on CPAP for now If not improved with o2, will consider CPAP therapy Cautioned against driving when sleepy - provigil also an option but concern about cardiac issues For increased leg movments during sleep, chk iron & ferritin level

## 2011-10-08 NOTE — Progress Notes (Signed)
  Subjective:    Patient ID: [name], male    DOB: 1921-04-17, 78 y.o.   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  The patient has a history of obstructive sleep apnea based on a study done years ago - did not tolerate CPAP therapy then for unclear reason - poor mask fit ? He has last seen his sleep specialist 5 or 6 years ago. He was unable to tolerate CPAP. He has lost weight since his original diagnosis but  noticed that he has been feeling sleepy while driving recently.  ESS 10/24.  Bedtime 10p, latency 15 mins, 3-4 awakenings, sleeps on his rt side wit hob raises, oob at 0630, snoring +, he has lost 30 lbs in the last 2 yrs. Drinks 2 cups of coffee in am  He underwent bladder resection for cancer in 2011 with ileal conduit & has to let stoma drain at night - this also interrupts his sleep.. PSG >>Mild obstructive sleep apnea -ahi 9/h  with predominant hypoxemia causing sleep fragmentation and oxygen desaturation. A few central apneas were noted related to sleep awake transition. Significant limb movements were noted, however, very few of these were related with arousals. The significance of this is unclear. Note, that the patient gives a history of leg cramps.  Significant oxygen desaturation even without respiratory events suggestive of underlying cardiopulmonary disease. Few runs of PVCs up to 8-9 beats were noted.     Review of Systems Patient denies significant dyspnea,cough, hemoptysis,  chest pain, palpitations, pedal edema, orthopnea, paroxysmal nocturnal dyspnea, lightheadedness, nausea, vomiting, abdominal or  leg pains      Objective:   Physical Exam  Gen. Pleasant, well-nourished, in no distress ENT - no lesions, no post nasal drip Neck: No JVD, no thyromegaly, no carotid bruits Lungs: no use of accessory muscles, no dullness to percussion, clear without rales or rhonchi  Cardiovascular: Rhythm  regular, heart sounds  normal, no murmurs or gallops, no peripheral edema Musculoskeletal: No deformities, no cyanosis or clubbing        Assessment & Plan:

## 2011-10-13 ENCOUNTER — Telehealth: Payer: Self-pay | Admitting: Pulmonary Disease

## 2011-10-13 NOTE — Telephone Encounter (Signed)
See Append to lab results ATC pt at the number provided, and msg states that the call can not be completed as dialed Called home number and [facility]

## 2011-10-18 NOTE — Telephone Encounter (Signed)
ATC x 1. Msg for cell # says this call cannot be completed as dialed. LMOMTCB at home number.

## 2011-10-19 NOTE — Telephone Encounter (Signed)
Reviewed pt's chart.  Looks like JR discuss lab results with pt on 10/15/11.  Therefore will sign off on this message.

## 2011-10-29 ENCOUNTER — Encounter: Payer: Self-pay | Admitting: Cardiology

## 2011-10-29 ENCOUNTER — Ambulatory Visit (INDEPENDENT_AMBULATORY_CARE_PROVIDER_SITE_OTHER): Payer: Medicare Other | Admitting: Cardiology

## 2011-10-29 VITALS — BP 100/75 | HR 52 | Ht 70.0 in | Wt 194.0 lb

## 2011-10-29 DIAGNOSIS — I509 Heart failure, unspecified: Secondary | ICD-10-CM

## 2011-10-29 DIAGNOSIS — E785 Hyperlipidemia, unspecified: Secondary | ICD-10-CM

## 2011-10-29 DIAGNOSIS — I350 Nonrheumatic aortic (valve) stenosis: Secondary | ICD-10-CM

## 2011-10-29 DIAGNOSIS — I6529 Occlusion and stenosis of unspecified carotid artery: Secondary | ICD-10-CM

## 2011-10-29 DIAGNOSIS — I359 Nonrheumatic aortic valve disorder, unspecified: Secondary | ICD-10-CM

## 2011-10-29 DIAGNOSIS — I10 Essential (primary) hypertension: Secondary | ICD-10-CM

## 2011-10-29 NOTE — Assessment & Plan Note (Signed)
He is overdue for Dopplers and I will schedule this to be done.  He had mild bilateral plaque in 2010.

## 2011-10-29 NOTE — Progress Notes (Signed)
HPI The patient presents for follow up of aortic valve replacement.  He was recently noted to have a run of NSVT on telemetry during a sleep study.  This was asymptomatic.  He is being followed by Vassie Loll and recently has been treated with CPAP and oxygen at night.  This has helped with some dyspnea.  Since I last saw him he has had no new cardiovascular complaints.  He denies any palpitations, presyncope or syncope. He has no chest pressure, neck or arm discomfort. He has had no weight gain or change in some mild lower extremity edema. He is able to keep up with his activities and in particular his painting. His  No Known Allergies  Current Outpatient Prescriptions  Medication Sig Dispense Refill  . albuterol (PROVENTIL HFA;VENTOLIN HFA) 108 (90 BASE) MCG/ACT inhaler Inhale 2 puffs into the lungs every 6 (six) hours as needed for wheezing.  1 Inhaler  1  . ALPHA-LIPOIC ACID PO Take 1 capsule by mouth daily.        . B Complex-C (B-COMPLEX WITH VITAMIN C) tablet Take 1 tablet by mouth daily.        Marland Kitchen BETA CAROTENE PO Take 1 tablet by mouth daily.        . Calcium-Magnesium-Zinc 167-83-8 MG TABS Take 1 tablet by mouth daily.      . calcium-vitamin D (OSCAL WITH D) 500-200 MG-UNIT per tablet Take 1 tablet by mouth daily.      Marland Kitchen co-enzyme Q-10 30 MG capsule Take 30 mg by mouth daily.        Marland Kitchen esomeprazole (NEXIUM) 40 MG capsule Take 1 capsule (40 mg total) by mouth daily before breakfast. Take 1 capsule 30 minutes before morning meal.  30 capsule  3  . furosemide (LASIX) 40 MG tablet TAKE 1 TABLET BY MOUTH EVERY DAY  30 tablet  5  . Ginkgo Biloba (GINKOBA) 40 MG TABS Take 1 tablet by mouth daily.        . Ginseng 250 MG CAPS Take 1 capsule by mouth daily.        . Glucosamine-Chondroitin (GLUCOSAMINE CHONDR COMPLEX PO) Take by mouth.        Marland Kitchen ibuprofen (ADVIL,MOTRIN) 200 MG tablet Take 2 tablets at bedtime as needed.       . Lactobacillus (ACIDOPHILUS) CAPS Take by mouth.        . levothyroxine  (SYNTHROID, LEVOTHROID) 100 MCG tablet Take 1 tablet (100 mcg total) by mouth daily.  30 tablet  3  . Melatonin 3 MG TABS Take by mouth.        . Multiple Vitamin (MULTIVITAMIN) tablet Take 1 tablet by mouth daily.      . Nutritional Supplements (DHEA PO) Take 1 tablet by mouth at bedtime.      Docia Barrier IN Inhale into the lungs Nightly.      . Selenium (SELENIMIN-200) 200 MCG TABS Take 1 tablet by mouth daily.        . simvastatin (ZOCOR) 10 MG tablet TAKE 1 TABLET BY MOUTH EVERY DAY  30 tablet  3    Past Medical History  Diagnosis Date  . History of prostate cancer     Dr Vonita Moss s/p surgery and radiation  . CHF (congestive heart failure)     preserved EF  . COPD (chronic obstructive pulmonary disease)   . Diverticulosis of colon   . History of pulmonary embolism   . Aortic stenosis   . ILD (interstitial lung disease)   .  Carotid stenosis, bilateral     30% bilateral  . Urothelial carcinoma     of the bladder    Past Surgical History  Procedure Date  . Aortic valve replacement 07/28/2007    #21 Edwards pericardial valve model 2700  . Inguinal hernia repair     left  . Tonsillectomy   . Prostate surgery     transurethral resection of prostate  . Esophagogastroduodenoscopy 05/08/2003  . Total hip arthroplasty     left hip repair  . Cystectomy 03/18/10    radical  . Robot assisted laparoscopic complete cystect ileal conduit 03/18/10    ileal conduit urinary diversion  . Colonoscopy 05/08/2003   ROS:  Decreased balance. Otherwise as stated in the HPI and negative for all other systems.  PHYSICAL EXAM BP 100/75  Pulse 52  Ht 5\' 10"  (1.778 m)  Wt 194 lb (87.998 kg)  BMI 27.84 kg/m2 GENERAL:  Well appearing HEENT:  Pupils equal round and reactive, fundi not visualized, oral mucosa unremarkable NECK:  No jugular venous distention, waveform within normal limits, carotid upstroke brisk and symmetric, no bruits, no thyromegaly LYMPHATICS:  No cervical, inguinal  adenopathy LUNGS:  Clear to auscultation bilaterally BACK:  No CVA tenderness CHEST:  Unremarkable HEART:  PMI not displaced or sustained,S1 and S2 within normal limits, no S3, no S4, no clicks, no rubs, apical soft systolic murmur early peaking and radiating out the aortic outlfow tract ABD:  Flat, positive bowel sounds normal in frequency in pitch, no bruits, no rebound, no guarding, no midline pulsatile mass, no hepatomegaly, no splenomegaly, urostomy bag EXT:  2 plus pulses throughout, mild edema, no cyanosis no clubbing SKIN:  No rashes no nodules NEURO:  Cranial nerves II through XII grossly intact, motor grossly intact throughout PSYCH:  Cognitively intact, oriented to person place and time  ASSESSMENT AND PLAN

## 2011-10-29 NOTE — Assessment & Plan Note (Signed)
Given a dysrhythmia noted I will check an echocardiogram. He has not had one in a few years. This will assess the aortic valve as well as his LV function.

## 2011-10-29 NOTE — Assessment & Plan Note (Signed)
His last LDL in July of last year was 104.  He will continue with the current meds.

## 2011-10-29 NOTE — Assessment & Plan Note (Signed)
The blood pressure is at target. No change in medications is indicated. We will continue with therapeutic lifestyle changes (TLC).  

## 2011-10-29 NOTE — Patient Instructions (Addendum)
Continue current medications as listed.  Your physician has requested that you have a carotid duplex. This test is an ultrasound of the carotid arteries in your neck. It looks at blood flow through these arteries that supply the brain with blood. Allow one hour for this exam. There are no restrictions or special instructions.  Your physician has requested that you have an echocardiogram. Echocardiography is a painless test that uses sound waves to create images of your heart. It provides your doctor with information about the size and shape of your heart and how well your heart's chambers and valves are working. This procedure takes approximately one hour. There are no restrictions for this procedure.  Follow up in 1 year with Dr Antoine Poche.  You will receive a letter in the mail 2 months before you are due.  Please call us when you receive this letter to schedule your follow up appointment.

## 2011-11-03 ENCOUNTER — Telehealth: Payer: Self-pay | Admitting: Internal Medicine

## 2011-11-03 MED ORDER — ESOMEPRAZOLE MAGNESIUM 40 MG PO CPDR: 40.0000 mg | DELAYED_RELEASE_CAPSULE | Freq: Every day | ORAL | Status: DC

## 2011-11-03 NOTE — Telephone Encounter (Signed)
rx sent in electronically 

## 2011-11-03 NOTE — Telephone Encounter (Signed)
Refill- nexium DR 40mg  capsule. Take one capsule by mouth every day 30 minutes before morning meal. Qty 30 last fill 2.26.13

## 2011-11-11 ENCOUNTER — Ambulatory Visit (HOSPITAL_COMMUNITY): Payer: Medicare Other | Attending: Cardiovascular Disease

## 2011-11-11 ENCOUNTER — Other Ambulatory Visit: Payer: Self-pay

## 2011-11-11 DIAGNOSIS — E785 Hyperlipidemia, unspecified: Secondary | ICD-10-CM | POA: Insufficient documentation

## 2011-11-11 DIAGNOSIS — R011 Cardiac murmur, unspecified: Secondary | ICD-10-CM | POA: Insufficient documentation

## 2011-11-11 DIAGNOSIS — J449 Chronic obstructive pulmonary disease, unspecified: Secondary | ICD-10-CM | POA: Insufficient documentation

## 2011-11-11 DIAGNOSIS — I359 Nonrheumatic aortic valve disorder, unspecified: Secondary | ICD-10-CM | POA: Insufficient documentation

## 2011-11-11 DIAGNOSIS — I350 Nonrheumatic aortic (valve) stenosis: Secondary | ICD-10-CM

## 2011-11-11 DIAGNOSIS — I10 Essential (primary) hypertension: Secondary | ICD-10-CM | POA: Insufficient documentation

## 2011-11-11 DIAGNOSIS — J4489 Other specified chronic obstructive pulmonary disease: Secondary | ICD-10-CM | POA: Insufficient documentation

## 2011-11-11 DIAGNOSIS — Z954 Presence of other heart-valve replacement: Secondary | ICD-10-CM | POA: Insufficient documentation

## 2011-11-11 DIAGNOSIS — I679 Cerebrovascular disease, unspecified: Secondary | ICD-10-CM | POA: Insufficient documentation

## 2011-11-11 DIAGNOSIS — I509 Heart failure, unspecified: Secondary | ICD-10-CM | POA: Insufficient documentation

## 2011-11-15 ENCOUNTER — Encounter (INDEPENDENT_AMBULATORY_CARE_PROVIDER_SITE_OTHER): Payer: Medicare Other

## 2011-11-15 ENCOUNTER — Encounter: Payer: Self-pay | Admitting: Pulmonary Disease

## 2011-11-15 ENCOUNTER — Ambulatory Visit (INDEPENDENT_AMBULATORY_CARE_PROVIDER_SITE_OTHER): Payer: Medicare Other | Admitting: Pulmonary Disease

## 2011-11-15 VITALS — BP 130/72 | HR 58 | Temp 97.8°F | Ht 70.0 in | Wt 195.8 lb

## 2011-11-15 DIAGNOSIS — C679 Malignant neoplasm of bladder, unspecified: Secondary | ICD-10-CM

## 2011-11-15 DIAGNOSIS — G4733 Obstructive sleep apnea (adult) (pediatric): Secondary | ICD-10-CM

## 2011-11-15 DIAGNOSIS — G458 Other transient cerebral ischemic attacks and related syndromes: Secondary | ICD-10-CM

## 2011-11-15 DIAGNOSIS — R930 Abnormal findings on diagnostic imaging of skull and head, not elsewhere classified: Secondary | ICD-10-CM

## 2011-11-15 DIAGNOSIS — I6529 Occlusion and stenosis of unspecified carotid artery: Secondary | ICD-10-CM

## 2011-11-15 NOTE — Progress Notes (Signed)
  Subjective:    Patient ID: [name], male    DOB: 27-Apr-1921, 78 y.o.   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  The patient has a history of obstructive sleep apnea based on a study done years ago - did not tolerate CPAP therapy then for unclear reason - poor mask fit ? He has last seen his sleep specialist 5 or 6 years ago. He was unable to tolerate CPAP. He has lost weight since his original diagnosis   ESS 10/24.  Bedtime 10p, latency 15 mins, 3-4 awakenings, sleeps on his rt side wit hob raises, oob at 0630, snoring +, he has lost 30 lbs in the last 2 yrs. Drinks 2 cups of coffee in am  He underwent bladder resection for cancer in 2011 with ileal conduit & has to let stoma drain at night - this also interrupts his sleep..  PSG >>Mild obstructive sleep apnea -ahi 9/h with predominant hypoxemia causing sleep fragmentation and oxygen desaturation. A few central apneas were noted related to sleep awake transition. Significant limb movements were noted, however, very few of these were related with arousals.  Note, that the patient gives a history of leg cramps.  Significant oxygen desaturation even without respiratory events suggestive of underlying cardiopulmonary disease. Few runs of PVCs up to 8-9 beats were noted.   >> started on 2 L o2, fe supplements   11/15/2011 Pt states his breathing has been doing pretty good. Pt still using oxygen at bedtime and feels like it helps.  Daughter accompanies, he is compliant with O2, drives only x 20 mins at the most. Saw Cards -  Review of Systems Patient denies significant dyspnea,cough, hemoptysis,  chest pain, palpitations, pedal edema, orthopnea, paroxysmal nocturnal dyspnea, lightheadedness, nausea, vomiting, abdominal or  leg pains      Objective:   Physical Exam  Gen. Pleasant, well-nourished, in no distress ENT - no lesions, no post nasal drip Neck: No JVD, no  thyromegaly, no carotid bruits Lungs: no use of accessory muscles, no dullness to percussion, clear without rales or rhonchi  Cardiovascular: Rhythm regular, heart sounds  normal, no murmurs or gallops, no peripheral edema Musculoskeletal: No deformities, no cyanosis or clubbing        Assessment & Plan:

## 2011-11-15 NOTE — Patient Instructions (Signed)
We will call you back with oxygen results Check your iron levels 1 week before your next appt

## 2011-11-17 NOTE — Assessment & Plan Note (Signed)
Mild, does not need CPAP at this time. O2 desaturation during sleep without events may be related to underlying COPD - ct O2 during sleep Rechk iron levels before next appt

## 2011-11-17 NOTE — Assessment & Plan Note (Signed)
Doubt further imaging needed

## 2011-11-19 ENCOUNTER — Ambulatory Visit: Payer: Medicare Other | Admitting: Pulmonary Disease

## 2011-11-19 ENCOUNTER — Telehealth: Payer: Self-pay | Admitting: Internal Medicine

## 2011-11-19 MED ORDER — LEVOTHYROXINE SODIUM 100 MCG PO TABS: 100.0000 ug | ORAL_TABLET | Freq: Every day | ORAL | Status: DC

## 2011-11-19 NOTE — Telephone Encounter (Signed)
rx sent in electronically 

## 2011-11-19 NOTE — Telephone Encounter (Signed)
Refill- levothyroxine tablet. Take one tablet every day. Qty 30 last fill 2.28.13

## 2011-11-26 ENCOUNTER — Telehealth: Payer: Self-pay | Admitting: Cardiology

## 2011-11-26 NOTE — Telephone Encounter (Signed)
Please return call to patient regarding test results, he can be reached at [phone]

## 2011-11-29 ENCOUNTER — Telehealth: Payer: Self-pay | Admitting: Pulmonary Disease

## 2011-11-29 NOTE — Telephone Encounter (Signed)
Left message at pt's home number that the phone left before has a recording that states the number last been disconnected.

## 2011-11-29 NOTE — Telephone Encounter (Signed)
ONO on 2l shows only 12 min desatn - O2 seems to be effective - stay at this level

## 2011-11-30 ENCOUNTER — Telehealth: Payer: Self-pay | Admitting: Cardiology

## 2011-11-30 NOTE — Telephone Encounter (Signed)
Fu call °Pt returning your call  °

## 2011-11-30 NOTE — Telephone Encounter (Signed)
Number listed has been changed disconnected or no longer in service

## 2011-11-30 NOTE — Telephone Encounter (Signed)
Called pt and left message of results and requested he call back if questions

## 2011-11-30 NOTE — Telephone Encounter (Signed)
lmomtcb x1 

## 2011-12-01 ENCOUNTER — Encounter: Payer: Self-pay | Admitting: Pulmonary Disease

## 2011-12-01 NOTE — Telephone Encounter (Signed)
Returning call.

## 2011-12-01 NOTE — Telephone Encounter (Signed)
lmomtcb x2 for pt and for mary pt daughter

## 2011-12-03 NOTE — Telephone Encounter (Signed)
Pt's daughter, Corrie Dandy, returned Mindy's call.  Antionette Fairy

## 2011-12-03 NOTE — Telephone Encounter (Signed)
lmomtcb for [name] 

## 2011-12-03 NOTE — Telephone Encounter (Signed)
lmomtcb for mary 

## 2011-12-08 ENCOUNTER — Other Ambulatory Visit: Payer: Self-pay | Admitting: Internal Medicine

## 2011-12-08 ENCOUNTER — Encounter: Payer: Self-pay | Admitting: *Deleted

## 2011-12-08 NOTE — Telephone Encounter (Signed)
lmomtcb--will send letter out to pt home advising to call office for results

## 2011-12-15 ENCOUNTER — Telehealth: Payer: Self-pay | Admitting: Pulmonary Disease

## 2011-12-15 NOTE — Telephone Encounter (Signed)
Spoke with patient; states he had a doppler done and needed the results. I informed the patient that Dr Antoine Poche was the ordering MD and he should call his office to speak with the nurse there. Pt verbalized his understanding and will call their office.

## 2011-12-15 NOTE — Telephone Encounter (Signed)
Spoke with Crystal with [facility]. She states that the pt needs order for portable liquid o2. All he has now if the o2 concentrator. She is going to be faxing over her o2 eval from this am for RA to review. Please advise, thanks!

## 2011-12-23 NOTE — Telephone Encounter (Signed)
Reviewed comprehensive o2 evaln by dme - unclear why this was done desatn to 87% on walking 178 steps - corrected by 2 L o2 Liquid O2 recommended Will hold off until d/w pt on next visit

## 2011-12-24 NOTE — Telephone Encounter (Signed)
Crystal is aware that Dr. Vassie Loll will discuss at next OV.Carron Curie, CMA

## 2011-12-24 NOTE — Telephone Encounter (Signed)
LMTCBx1 with crystal

## 2011-12-31 ENCOUNTER — Encounter: Payer: Self-pay | Admitting: Pulmonary Disease

## 2012-02-21 ENCOUNTER — Ambulatory Visit: Payer: Medicare Other | Admitting: Pulmonary Disease

## 2012-03-02 ENCOUNTER — Other Ambulatory Visit: Payer: Self-pay | Admitting: Internal Medicine

## 2012-03-09 ENCOUNTER — Other Ambulatory Visit: Payer: Self-pay | Admitting: Internal Medicine

## 2012-03-27 ENCOUNTER — Ambulatory Visit (INDEPENDENT_AMBULATORY_CARE_PROVIDER_SITE_OTHER): Payer: Medicare Other | Admitting: Pulmonary Disease

## 2012-03-27 ENCOUNTER — Encounter: Payer: Self-pay | Admitting: Pulmonary Disease

## 2012-03-27 VITALS — BP 106/66 | HR 57 | Temp 97.7°F | Ht 70.0 in | Wt 196.2 lb

## 2012-03-27 DIAGNOSIS — G4733 Obstructive sleep apnea (adult) (pediatric): Secondary | ICD-10-CM

## 2012-03-27 DIAGNOSIS — J449 Chronic obstructive pulmonary disease, unspecified: Secondary | ICD-10-CM

## 2012-03-27 DIAGNOSIS — R930 Abnormal findings on diagnostic imaging of skull and head, not elsewhere classified: Secondary | ICD-10-CM

## 2012-03-27 NOTE — Assessment & Plan Note (Signed)
On noct O2 since 4/13 with good compliance & benefit

## 2012-03-27 NOTE — Assessment & Plan Note (Signed)
FU CXR on next visit

## 2012-03-27 NOTE — Progress Notes (Signed)
  Subjective:    Patient ID: [name], male    DOB: 14-Nov-1920, 78 y.o.   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  The patient has a history of obstructive sleep apnea based on a study done years ago - did not tolerate CPAP therapy then for ? poor mask fit.  He has lost weight since his original diagnosis  Bedtime 10p, latency 15 mins, 3-4 awakenings, sleeps on his rt side wit hob raises, oob at 0630, snoring +, he has lost 30 lbs in the last 2 yrs. Drinks 2 cups of coffee in am  He underwent bladder resection for cancer in 2011 with ileal conduit & has to let stoma drain at night - this also interrupts his sleep..  PSG >>Mild obstructive sleep apnea -ahi 9/h with predominant hypoxemia causing sleep fragmentation and oxygen desaturation. A few central apneas were noted related to sleep awake transition. Significant limb movements were noted, however, very few of these were related with arousals. He has a history of leg cramps.  Significant oxygen desaturation even without respiratory events suggestive of underlying cardiopulmonary disease. Few runs of PVCs up to 8-9 beats were noted.   >> started on 2 L o2, fe supplements    03/27/2012 4 m FU Pt states his breathing has been doing pretty good. Pt still using oxygen at bedtime and feels like it helps.  he is compliant with O2, drives only x 20 mins at the most.  Saw Cards - ONO on 2l shows only 12 min desatn - O2 seems to be effective  comprehensive o2 evaln by dme - recommended O2 but he does not desatn on wlaking around the office today. Not using albuterol or symbicort MDI regularly,  Denies excessive somnolence    Review of Systems neg for any significant sore throat, dysphagia, itching, sneezing, nasal congestion or excess/ purulent secretions, fever, chills, sweats, unintended wt loss, pleuritic or exertional cp, hempoptysis, orthopnea pnd or change in  chronic leg swelling. Also denies presyncope, palpitations, heartburn, abdominal pain, nausea, vomiting, diarrhea or change in bowel or urinary habits, dysuria,hematuria, rash, arthralgias, visual complaints, headache, numbness weakness or ataxia.     Objective:   Physical Exam  Gen. Pleasant, well-nourished, in no distress ENT - no lesions, no post nasal drip Neck: No JVD, no thyromegaly, no carotid bruits Lungs: no use of accessory muscles, no dullness to percussion, clear without rales or rhonchi  Cardiovascular: Rhythm regular, heart sounds  normal, no murmurs or gallops, no peripheral edema Musculoskeletal: No deformities, no cyanosis or clubbing        Assessment & Plan:

## 2012-03-27 NOTE — Assessment & Plan Note (Signed)
Has done well off cpap Ct on o2 for now Needs ferritin recheck at some point

## 2012-03-27 NOTE — Patient Instructions (Signed)
Take symbicort daily Ambulatory satn

## 2012-04-10 ENCOUNTER — Other Ambulatory Visit: Payer: Self-pay | Admitting: Internal Medicine

## 2012-04-11 ENCOUNTER — Telehealth: Payer: Self-pay | Admitting: Internal Medicine

## 2012-04-11 MED ORDER — FUROSEMIDE 40 MG PO TABS: 40.0000 mg | ORAL_TABLET | Freq: Every day | ORAL | Status: DC

## 2012-04-11 NOTE — Telephone Encounter (Signed)
Opened in error

## 2012-04-11 NOTE — Telephone Encounter (Signed)
Rx sent to pharmacy   

## 2012-04-11 NOTE — Telephone Encounter (Signed)
Furosemide 40mg  tablet. Take one tablet by mouth every day. Qty 30 last fill 7.25.13

## 2012-05-08 ENCOUNTER — Emergency Department (HOSPITAL_COMMUNITY)
Admission: EM | Admit: 2012-05-08 | Discharge: 2012-05-08 | Disposition: A | Discharge status: Home / Self Care | Payer: No Typology Code available for payment source | Attending: Emergency Medicine | Admitting: Emergency Medicine

## 2012-05-08 ENCOUNTER — Emergency Department (HOSPITAL_COMMUNITY): Payer: No Typology Code available for payment source

## 2012-05-08 ENCOUNTER — Ambulatory Visit (HOSPITAL_BASED_OUTPATIENT_CLINIC_OR_DEPARTMENT_OTHER)
Admission: RE | Admit: 2012-05-08 | Discharge: 2012-05-08 | Disposition: A | Discharge status: Home / Self Care | Payer: Medicare Other | Source: Ambulatory Visit | Attending: Family | Admitting: Family

## 2012-05-08 ENCOUNTER — Telehealth: Payer: Self-pay | Admitting: Pulmonary Disease

## 2012-05-08 ENCOUNTER — Encounter: Payer: Self-pay | Admitting: Family

## 2012-05-08 ENCOUNTER — Encounter (HOSPITAL_COMMUNITY): Payer: Self-pay | Admitting: Emergency Medicine

## 2012-05-08 ENCOUNTER — Ambulatory Visit (INDEPENDENT_AMBULATORY_CARE_PROVIDER_SITE_OTHER): Payer: Medicare Other | Admitting: Family

## 2012-05-08 VITALS — BP 110/70 | HR 57 | Temp 97.4°F | Resp 16 | Wt 202.0 lb

## 2012-05-08 DIAGNOSIS — M899 Disorder of bone, unspecified: Secondary | ICD-10-CM | POA: Insufficient documentation

## 2012-05-08 DIAGNOSIS — I10 Essential (primary) hypertension: Secondary | ICD-10-CM

## 2012-05-08 DIAGNOSIS — Z23 Encounter for immunization: Secondary | ICD-10-CM

## 2012-05-08 DIAGNOSIS — Z8546 Personal history of malignant neoplasm of prostate: Secondary | ICD-10-CM | POA: Insufficient documentation

## 2012-05-08 DIAGNOSIS — M25559 Pain in unspecified hip: Secondary | ICD-10-CM

## 2012-05-08 DIAGNOSIS — S20219A Contusion of unspecified front wall of thorax, initial encounter: Secondary | ICD-10-CM | POA: Insufficient documentation

## 2012-05-08 DIAGNOSIS — S301XXA Contusion of abdominal wall, initial encounter: Secondary | ICD-10-CM | POA: Insufficient documentation

## 2012-05-08 DIAGNOSIS — C679 Malignant neoplasm of bladder, unspecified: Secondary | ICD-10-CM

## 2012-05-08 DIAGNOSIS — S139XXA Sprain of joints and ligaments of unspecified parts of neck, initial encounter: Secondary | ICD-10-CM | POA: Insufficient documentation

## 2012-05-08 DIAGNOSIS — M5030 Other cervical disc degeneration, unspecified cervical region: Secondary | ICD-10-CM | POA: Insufficient documentation

## 2012-05-08 DIAGNOSIS — M25552 Pain in left hip: Secondary | ICD-10-CM | POA: Insufficient documentation

## 2012-05-08 DIAGNOSIS — E785 Hyperlipidemia, unspecified: Secondary | ICD-10-CM

## 2012-05-08 DIAGNOSIS — S0990XA Unspecified injury of head, initial encounter: Secondary | ICD-10-CM | POA: Insufficient documentation

## 2012-05-08 DIAGNOSIS — R079 Chest pain, unspecified: Secondary | ICD-10-CM | POA: Insufficient documentation

## 2012-05-08 DIAGNOSIS — G4733 Obstructive sleep apnea (adult) (pediatric): Secondary | ICD-10-CM

## 2012-05-08 DIAGNOSIS — S161XXA Strain of muscle, fascia and tendon at neck level, initial encounter: Secondary | ICD-10-CM

## 2012-05-08 DIAGNOSIS — E039 Hypothyroidism, unspecified: Secondary | ICD-10-CM

## 2012-05-08 DIAGNOSIS — Z86718 Personal history of other venous thrombosis and embolism: Secondary | ICD-10-CM | POA: Insufficient documentation

## 2012-05-08 LAB — POCT I-STAT, CHEM 8
BUN: 49 mg/dL — ABNORMAL HIGH (ref 6–23)
Calcium, Ion: 1.07 mmol/L — ABNORMAL LOW (ref 1.13–1.30)
Chloride: 106 mEq/L (ref 96–112)
Creatinine, Ser: 2.2 mg/dL — ABNORMAL HIGH (ref 0.50–1.35)
TCO2: 20 mmol/L (ref 0–100)

## 2012-05-08 LAB — CBC WITH DIFFERENTIAL/PLATELET
Basophils Absolute: 0 10*3/uL (ref 0.0–0.1)
Eosinophils Relative: 1 % (ref 0–5)
HCT: 31.3 % — ABNORMAL LOW (ref 39.0–52.0)
Hemoglobin: 10.4 g/dL — ABNORMAL LOW (ref 13.0–17.0)
Lymphocytes Relative: 6 % — ABNORMAL LOW (ref 12–46)
Lymphs Abs: 0.6 10*3/uL — ABNORMAL LOW (ref 0.7–4.0)
MCV: 90.5 fL (ref 78.0–100.0)
Monocytes Absolute: 0.9 10*3/uL (ref 0.1–1.0)
Neutro Abs: 8.5 10*3/uL — ABNORMAL HIGH (ref 1.7–7.7)
RBC: 3.46 MIL/uL — ABNORMAL LOW (ref 4.22–5.81)
WBC: 10 10*3/uL (ref 4.0–10.5)

## 2012-05-08 LAB — HEPATIC FUNCTION PANEL
AST: 14 U/L (ref 0–37)
Albumin: 3.4 g/dL — ABNORMAL LOW (ref 3.5–5.2)
Alkaline Phosphatase: 171 U/L — ABNORMAL HIGH (ref 39–117)
Total Bilirubin: 0.7 mg/dL (ref 0.3–1.2)

## 2012-05-08 LAB — BASIC METABOLIC PANEL
BUN: 57 mg/dL — ABNORMAL HIGH (ref 6–23)
CO2: 23 mEq/L (ref 19–32)
Chloride: 106 mEq/L (ref 96–112)
Glucose, Bld: 109 mg/dL — ABNORMAL HIGH (ref 70–99)
Potassium: 4.4 mEq/L (ref 3.5–5.3)
Sodium: 139 mEq/L (ref 135–145)

## 2012-05-08 MED ORDER — HYDROCODONE-ACETAMINOPHEN 5-500 MG PO TABS: 1.0000 | ORAL_TABLET | Freq: Four times a day (QID) | ORAL | Status: DC | PRN
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
## 2012-05-08 NOTE — Assessment & Plan Note (Signed)
He reports some issues with his CPAP machine.  I have advised him to contact home health to evaluate.

## 2012-05-08 NOTE — Telephone Encounter (Signed)
Advised daughter, Corrie Dandy, that I would have a nurse check on an ABX Rx & advise.  I told Corrie Dandy that pt's last visit w/ RA was 03/27/12 & does not have a current appt w/ RA set as RA wanted to see pt again for a 6 mo ROV in 09/27/2012 & his schedule for this time is not yet available.  Mary verbalized understanding & will await returned call from nurse.  Antionette Fairy

## 2012-05-08 NOTE — Patient Instructions (Addendum)
Please complete your blood work prior to leaving.  Complete left hip x-ray on the first floor in the imaging department. Follow up in 3 months.

## 2012-05-08 NOTE — Assessment & Plan Note (Signed)
Pt follows with Dr. Alma Friendly.  He is s/p cystectomy.
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
## 2012-05-08 NOTE — Assessment & Plan Note (Signed)
X ray shows left hip prosthesis is intact.  Pt sees Dr. Wyline Mood.  Will refer back to ortho for evaluation.

## 2012-05-08 NOTE — Assessment & Plan Note (Signed)
Obtain TSH.  Continue synthroid.  

## 2012-05-08 NOTE — ED Notes (Signed)
Returns from CT 

## 2012-05-08 NOTE — Progress Notes (Signed)
Subjective:    Patient ID: [name], male    DOB: 08/28/1920, 78 y.o.   MRN: [id number]  HPI  Mr.  Miles is an 78 yr old male who presents today with chief complaint of left hip pain.  Reports that left hip pain was brief but "hurt bad." Tried acetaminophen some impovement. He has hx of left hip replacement.  Generally he does not have hip pain.      Review of Systems    see HPI  Past Medical History  Diagnosis Date  . History of prostate cancer     Dr Vonita Moss s/p surgery and radiation  . CHF (congestive heart failure)     preserved EF  . COPD (chronic obstructive pulmonary disease)   . Diverticulosis of colon   . History of pulmonary embolism   . Aortic stenosis   . ILD (interstitial lung disease)   . Carotid stenosis, bilateral     30% bilateral  . Urothelial carcinoma     of the bladder    History   Social History  . Marital Status: Widowed    Spouse Name: N/A    Number of Children: 5  . Years of Education: N/A   Occupational History  . retired    Social History Main Topics  . Smoking status: Former Smoker -- 2.0 packs/day for 30 years    Types: Cigarettes    Quit date: 08/09/1981  . Smokeless tobacco: Never Used   Comment: 15-20 years ago (80 pk year history)  . Alcohol Use: 0.6 - 1.2 oz/week    1-2 Glasses of wine per week  . Drug Use: No  . Sexually Active: Not on file   Other Topics Concern  . Not on file   Social History Narrative   Retired Pensions consultant - now Tree surgeon Widowed - lives alone  4 living children   Alcohol use-yes (glass of wine per day) Former Smoker q 15-20 yrs ago (80 pk year history)         Past Surgical History  Procedure Date  . Aortic valve replacement 07/28/2007    #21 Edwards pericardial valve model 2700  . Inguinal hernia repair     left  . Tonsillectomy   . Prostate surgery     transurethral resection of prostate  . Esophagogastroduodenoscopy 05/08/2003  . Total hip arthroplasty     left hip repair  .  Cystectomy 03/18/10    radical  . Robot assisted laparoscopic complete cystect ileal conduit 03/18/10    ileal conduit urinary diversion  . Colonoscopy 05/08/2003    Family History  Problem Relation Age of Onset  . Lung cancer Mother   . Cancer Mother     Lung  . Prostate cancer Father   . Cancer Father     prostate  . Tuberculosis Paternal Grandfather     patient lived with and was exposed to-wa in the [facility] sent to [facility]    No Known Allergies  Current Outpatient Prescriptions on File Prior to Visit  Medication Sig Dispense Refill  . albuterol (PROVENTIL HFA;VENTOLIN HFA) 108 (90 BASE) MCG/ACT inhaler Inhale 2 puffs into the lungs every 6 (six) hours as needed for wheezing.  1 Inhaler  1  . ALPHA-LIPOIC ACID PO Take 1 capsule by mouth daily.        . B Complex-C (B-COMPLEX WITH VITAMIN C) tablet Take 1 tablet by mouth daily.        Marland Kitchen BETA CAROTENE PO Take 1 tablet by mouth  daily.        . calcium carbonate (OS-CAL) 600 MG TABS Take 600 mg by mouth 2 (two) times daily with a meal.      . Calcium-Magnesium-Zinc 167-83-8 MG TABS Take 1 tablet by mouth daily.      . furosemide (LASIX) 40 MG tablet Take 1 tablet (40 mg total) by mouth daily.  30 tablet  3  . Ginkgo Biloba (GINKOBA) 40 MG TABS Take 1 tablet by mouth daily.        . Ginseng 250 MG CAPS Take 1 capsule by mouth daily.        . Glucosamine-Chondroitin (GLUCOSAMINE CHONDR COMPLEX PO) Take by mouth.        Marland Kitchen ibuprofen (ADVIL,MOTRIN) 200 MG tablet Take 2 tablets at bedtime as needed.       . Lactobacillus (ACIDOPHILUS) CAPS Take by mouth.        . levothyroxine (SYNTHROID, LEVOTHROID) 100 MCG tablet TAKE 1 TABLET BY MOUTH DAILY  30 tablet  3  . Melatonin 3 MG TABS Take by mouth. As needed      . Multiple Vitamin (MULTIVITAMIN) tablet Take 1 tablet by mouth daily.      Marland Kitchen NEXIUM 40 MG capsule TAKE ONE CAPSULE EVERY DAY 30 MINUTES BEFORE BREAKFAST  30 capsule  3  . Nutritional Supplements (DHEA PO) Take 1 tablet by mouth at  bedtime.      Docia Barrier IN Inhale into the lungs Nightly.      . Selenium (SELENIMIN-200) 200 MCG TABS Take 1 tablet by mouth daily.        . simvastatin (ZOCOR) 10 MG tablet TAKE 1 TABLET BY MOUTH EVERY DAY  30 tablet  3  . budesonide-formoterol (SYMBICORT) 160-4.5 MCG/ACT inhaler Inhale 2 puffs into the lungs 2 (two) times daily.  1 Inhaler  1  . co-enzyme Q-10 30 MG capsule Take 30 mg by mouth daily.          BP 110/70  Pulse 57  Temp 97.4 F (36.3 C) (Oral)  Resp 16  Wt 202 lb (91.627 kg)  SpO2 95%    Objective:   Physical Exam  Constitutional: He is oriented to person, place, and time. He appears well-developed and well-nourished. No distress.  HENT:  Head: Normocephalic and atraumatic.  Cardiovascular: Normal rate and regular rhythm.   No murmur heard. Pulmonary/Chest: Breath sounds normal. No respiratory distress. He has no wheezes. He has no rales. He exhibits no tenderness.  Musculoskeletal: He exhibits no edema.       L hip full ROM, non-tender to palpation.   Neurological: He is alert and oriented to person, place, and time.  Skin: Skin is warm and dry. No rash noted. No erythema. No pallor.  Psychiatric: He has a normal mood and affect. His behavior is normal. Judgment and thought content normal.          Assessment & Plan:

## 2012-05-08 NOTE — Telephone Encounter (Signed)
[email or web address]  

## 2012-05-08 NOTE — Assessment & Plan Note (Signed)
BP Readings from Last 3 Encounters:  05/08/12 110/70  03/27/12 106/66  11/15/11 130/72  BP stable.  Obtain bment.

## 2012-05-09 ENCOUNTER — Telehealth: Payer: Self-pay | Admitting: Family

## 2012-05-09 NOTE — Telephone Encounter (Signed)
ATC Mary x1.  Mailbox is full and unable to LM.  Line then hung up.
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
## 2012-05-10 NOTE — Telephone Encounter (Signed)
Corrie Dandy states that she thinks the pt was mistaken on which doctor prescribed the abx. This was done by the urologist. Nothing further is needed at this time.

## 2012-05-11 NOTE — Telephone Encounter (Signed)
Could you pls call pt and see if he can arrange to be seen in office 9/4 please?

## 2012-05-12 NOTE — Telephone Encounter (Signed)
Notified pt of instructions below. Pt is currently without transportation and is unsure if he will be getting his car back. Reports that he has a lot going on at this time and will have to check with his family to see if he can arrange transportation. Pt reports that he will call us back on Monday to schedule follow up. He also states that he is seeing Dr Vernice Jefferson re: his bladder. Last appt with him was 2 weeks ago and has f/u on 05/28/12. Pt states he doesn't want multiple Providers working on the same issue.

## 2012-05-19 ENCOUNTER — Ambulatory Visit (INDEPENDENT_AMBULATORY_CARE_PROVIDER_SITE_OTHER): Payer: Medicare Other | Admitting: Family

## 2012-05-19 ENCOUNTER — Ambulatory Visit (HOSPITAL_BASED_OUTPATIENT_CLINIC_OR_DEPARTMENT_OTHER)
Admission: RE | Admit: 2012-05-19 | Discharge: 2012-05-19 | Disposition: A | Discharge status: Home / Self Care | Payer: Medicare Other | Source: Ambulatory Visit | Attending: Family | Admitting: Family

## 2012-05-19 ENCOUNTER — Other Ambulatory Visit: Payer: Self-pay | Admitting: Family

## 2012-05-19 ENCOUNTER — Encounter: Payer: Self-pay | Admitting: Family

## 2012-05-19 VITALS — BP 132/78 | HR 63 | Temp 97.7°F | Resp 14 | Wt 197.0 lb

## 2012-05-19 DIAGNOSIS — M25559 Pain in unspecified hip: Secondary | ICD-10-CM

## 2012-05-19 DIAGNOSIS — Z8546 Personal history of malignant neoplasm of prostate: Secondary | ICD-10-CM

## 2012-05-19 DIAGNOSIS — M25551 Pain in right hip: Secondary | ICD-10-CM

## 2012-05-19 DIAGNOSIS — N289 Disorder of kidney and ureter, unspecified: Secondary | ICD-10-CM

## 2012-05-19 DIAGNOSIS — C679 Malignant neoplasm of bladder, unspecified: Secondary | ICD-10-CM

## 2012-05-19 DIAGNOSIS — M1990 Unspecified osteoarthritis, unspecified site: Secondary | ICD-10-CM

## 2012-05-19 DIAGNOSIS — M169 Osteoarthritis of hip, unspecified: Secondary | ICD-10-CM | POA: Insufficient documentation

## 2012-05-19 DIAGNOSIS — M1610 Unilateral primary osteoarthritis, unspecified hip: Secondary | ICD-10-CM | POA: Insufficient documentation

## 2012-05-19 DIAGNOSIS — R195 Other fecal abnormalities: Secondary | ICD-10-CM

## 2012-05-19 DIAGNOSIS — R748 Abnormal levels of other serum enzymes: Secondary | ICD-10-CM

## 2012-05-19 DIAGNOSIS — D509 Iron deficiency anemia, unspecified: Secondary | ICD-10-CM
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
## 2012-05-19 NOTE — Patient Instructions (Signed)
Complete your blood work prior to leaving.  Please complete your x-ray on the first floor.  Keep your upcoming f/u apt with Dr. Retta Diones. You will be contact about your referral to Gastroenterology due to your anemia and trace blood in stool.  Please let us know if you have not heard back within 1 week about your referral. Follow up in 1 month.

## 2012-05-20 LAB — FERRITIN: Ferritin: 61 ng/mL (ref 22–322)

## 2012-05-20 LAB — BASIC METABOLIC PANEL
BUN: 43 mg/dL — ABNORMAL HIGH (ref 6–23)
CO2: 23 mEq/L (ref 19–32)
Chloride: 107 mEq/L (ref 96–112)
Creat: 1.9 mg/dL — ABNORMAL HIGH (ref 0.50–1.35)
Potassium: 4.7 mEq/L (ref 3.5–5.3)

## 2012-05-20 LAB — IRON: Iron: 18 ug/dL — ABNORMAL LOW (ref 42–165)

## 2012-05-22 ENCOUNTER — Telehealth: Payer: Self-pay | Admitting: Pulmonary Disease

## 2012-05-22 ENCOUNTER — Telehealth: Payer: Self-pay | Admitting: Family

## 2012-05-22 DIAGNOSIS — M1990 Unspecified osteoarthritis, unspecified site: Secondary | ICD-10-CM | POA: Insufficient documentation

## 2012-05-22 DIAGNOSIS — N289 Disorder of kidney and ureter, unspecified: Secondary | ICD-10-CM | POA: Insufficient documentation

## 2012-05-22 DIAGNOSIS — D509 Iron deficiency anemia, unspecified: Secondary | ICD-10-CM | POA: Insufficient documentation

## 2012-05-22 DIAGNOSIS — J8410 Pulmonary fibrosis, unspecified: Secondary | ICD-10-CM

## 2012-05-22 DIAGNOSIS — J449 Chronic obstructive pulmonary disease, unspecified: Secondary | ICD-10-CM

## 2012-05-22 NOTE — Assessment & Plan Note (Signed)
Checked follow up PSA due to elevated alk phos and it was normal.

## 2012-05-22 NOTE — Telephone Encounter (Signed)
I spoke with [name] and she is going to check into what else is needed. Pt is aware we are working on this. [name] wait [name] call back

## 2012-05-22 NOTE — Telephone Encounter (Signed)
[name] states ono study was signed 30 days after test. Patient needing to redo ono.   Please advise, RA thanks

## 2012-05-22 NOTE — Telephone Encounter (Signed)
Pt is aware of repeat ONO on RA. He was fine with this. Order has been placed. Please advise PCC's thanks

## 2012-05-22 NOTE — Assessment & Plan Note (Signed)
Pt is heme +.  Will refer to GI.  Increase iron to TID.

## 2012-05-22 NOTE — Telephone Encounter (Signed)
Spoke with [employer] and added test. Frozen specimen was received on 05/19/12 and test can be added.

## 2012-05-22 NOTE — Telephone Encounter (Signed)
Notified pt. He states he is taking slow fe OTC 2 twice a day. Advised pt he should take equivalent of 325mg  three times daily. Pt voices understanding and states he will check his dose. I see that alkaline phosphatase isoenzymes are still pending for the lab. Do I need to have these added on?

## 2012-05-22 NOTE — Telephone Encounter (Signed)
Yes please.  Maybe I ordered clinic collect? thanks

## 2012-05-22 NOTE — Telephone Encounter (Signed)
Duplicate message see phone note 05/22/12

## 2012-05-22 NOTE — Telephone Encounter (Signed)
Pls call pt and let him know that iron is low.  I would like him to take 325mg  iron tid.  I am not sure the dose that he has been taking.

## 2012-05-22 NOTE — Telephone Encounter (Signed)
OK to repeat- ONO on RA  Pl have [facility] contact us directly in future if there are issues like this . They should not bill this pt extra

## 2012-05-22 NOTE — Assessment & Plan Note (Signed)
Pt complains of R hip pain.  X ray shows degenerative changes. Recommended tylenol prn and that pt follow up with his orthopedist.

## 2012-05-22 NOTE — Assessment & Plan Note (Signed)
I am concerned about his complaint of intermittent hematuria.  Will notify his urologist.

## 2012-05-22 NOTE — Assessment & Plan Note (Signed)
Kidney function is improving.  He declined referral to nephrology.  Will monitor closely.

## 2012-05-23 NOTE — Telephone Encounter (Signed)
Order faxed to [facility] to do Oswaldo Done

## 2012-05-26 ENCOUNTER — Telehealth: Payer: Self-pay | Admitting: Family

## 2012-05-26 NOTE — Telephone Encounter (Signed)
Spoke with [name] at [facility] and she will re-send electronically and fax a copy.

## 2012-05-26 NOTE — Telephone Encounter (Signed)
Reviewed case with Dr. Retta Diones. (renal insufficiency/elevated alk phos/anemia)  He reports that pt's last creatinine in his office was 1.5.  Pt is scheduled for a CT with him- he tells me that he may also schedule pt for bone scan. He is requesting that we fax him his most recent lab work (creatitnine and blood count) please.  Also, could we please check status of alk phos iso enzymes? thanks

## 2012-05-28 ENCOUNTER — Telehealth: Payer: Self-pay | Admitting: Pulmonary Disease

## 2012-05-28 DIAGNOSIS — J8410 Pulmonary fibrosis, unspecified: Secondary | ICD-10-CM

## 2012-05-28 DIAGNOSIS — G4734 Idiopathic sleep related nonobstructive alveolar hypoventilation: Secondary | ICD-10-CM

## 2012-05-28 DIAGNOSIS — J449 Chronic obstructive pulmonary disease, unspecified: Secondary | ICD-10-CM

## 2012-05-28 NOTE — Telephone Encounter (Signed)
Mild desatn on RA Better than before - but qualifies him for O2 Pl ensure that [facility] has all info that they need

## 2012-05-29 NOTE — Telephone Encounter (Signed)
lmomtcb x1 

## 2012-05-30 NOTE — Telephone Encounter (Signed)
Have not received alk phos level from 05/19/12. Spoke with Clydie Braun at [place] and requested result be faxed again. All other labs faxed to Dr Retta Diones.

## 2012-05-30 NOTE — Telephone Encounter (Signed)
Lab result received and forwarded to Provider for review.  Please advise.

## 2012-05-31 NOTE — Telephone Encounter (Signed)
I spoke with patient about results and he verbalized understanding and had no questions. Pt had to re-do ONO since he was not set up on oxygen 30n days after the original test. PCC's please advise thanks

## 2012-05-31 NOTE — Telephone Encounter (Signed)
Order has been sent to [facility]'s.  

## 2012-05-31 NOTE — Telephone Encounter (Signed)
Reviewed

## 2012-05-31 NOTE — Telephone Encounter (Signed)
Need order for [facility] to continue 02 or start 02 ever which dr Vassie Loll wants Tobe Sos

## 2012-06-07 ENCOUNTER — Other Ambulatory Visit: Payer: Self-pay | Admitting: Urology

## 2012-06-07 DIAGNOSIS — C679 Malignant neoplasm of bladder, unspecified: Secondary | ICD-10-CM

## 2012-06-14 ENCOUNTER — Ambulatory Visit (HOSPITAL_COMMUNITY)
Admission: RE | Admit: 2012-06-14 | Discharge: 2012-06-14 | Disposition: A | Discharge status: Home / Self Care | Payer: Medicare Other | Source: Ambulatory Visit | Attending: Urology | Admitting: Urology

## 2012-06-14 DIAGNOSIS — C679 Malignant neoplasm of bladder, unspecified: Secondary | ICD-10-CM

## 2012-06-14 MED ORDER — DIATRIZOATE MEGLUMINE 30 % UR SOLN: Freq: Once | URETHRAL | Status: AC | PRN

## 2012-06-23 ENCOUNTER — Encounter: Payer: Self-pay | Admitting: Family

## 2012-06-23 ENCOUNTER — Ambulatory Visit (INDEPENDENT_AMBULATORY_CARE_PROVIDER_SITE_OTHER): Payer: Medicare Other | Admitting: Family

## 2012-06-23 VITALS — BP 130/70 | HR 59 | Temp 97.3°F | Resp 16 | Ht 71.0 in | Wt 190.0 lb

## 2012-06-23 DIAGNOSIS — D509 Iron deficiency anemia, unspecified: Secondary | ICD-10-CM

## 2012-06-23 DIAGNOSIS — D649 Anemia, unspecified: Secondary | ICD-10-CM

## 2012-06-23 DIAGNOSIS — C679 Malignant neoplasm of bladder, unspecified: Secondary | ICD-10-CM

## 2012-06-23 LAB — CBC WITH DIFFERENTIAL/PLATELET
Eosinophils Absolute: 0.1 10*3/uL (ref 0.0–0.7)
Eosinophils Relative: 2 % (ref 0–5)
HCT: 32.5 % — ABNORMAL LOW (ref 39.0–52.0)
Lymphocytes Relative: 18 % (ref 12–46)
Lymphs Abs: 1.4 10*3/uL (ref 0.7–4.0)
MCH: 28.9 pg (ref 26.0–34.0)
MCV: 87.8 fL (ref 78.0–100.0)
Monocytes Absolute: 0.9 10*3/uL (ref 0.1–1.0)
RBC: 3.7 MIL/uL — ABNORMAL LOW (ref 4.22–5.81)
WBC: 7.6 10*3/uL (ref 4.0–10.5)

## 2012-06-23 NOTE — Assessment & Plan Note (Signed)
Defer management to Urology.

## 2012-06-23 NOTE — Patient Instructions (Addendum)
Please complete your lab work prior to leaving. Please schedule a follow up appointment in 3 months.  

## 2012-06-23 NOTE — Progress Notes (Signed)
Subjective:    Patient ID: [name], male    DOB: 04/07/1921, 78 y.o.   MRN: [id number]  HPI  [name] is an 78 yr old male who presents today for follow up.  1) Heme + stool-  He was referred back to GI and was seen by Dr. Dorena Cookey on 05/24/12.  He reviewed his recent abdominal CT scan which did not note any suspicious lesions of the GI tract.  He recommended continuing him on nexium, minimizing use of ibuprofen ant that he continue his iron supplement. He will see him back in 3 months and will decide if he is a candidate for colonoscopy or EGD at that time.  2) Hx of bladder cancer- he is followed by Dr. Retta Diones who performed CT abdomen as noted above. Further imaging noted possible stricture of the left ureter. This is being managed by Urology.  The patient denies any further gross hematuria.    Review of Systems See HPI  Past Medical History  Diagnosis Date  . History of prostate cancer     Dr Vonita Moss s/p surgery and radiation  . CHF (congestive heart failure)     preserved EF  . COPD (chronic obstructive pulmonary disease)   . Diverticulosis of colon   . History of pulmonary embolism   . Aortic stenosis   . ILD (interstitial lung disease)   . Carotid stenosis, bilateral     30% bilateral  . Urothelial carcinoma     of the bladder    History   Social History  . Marital Status: Widowed    Spouse Name: N/A    Number of Children: 5  . Years of Education: N/A   Occupational History  . retired    Social History Main Topics  . Smoking status: Former Smoker -- 2.0 packs/day for 30 years    Types: Cigarettes    Quit date: 08/09/1981  . Smokeless tobacco: Never Used     Comment: 15-20 years ago (80 pk year history)  . Alcohol Use: 0.6 - 1.2 oz/week    1-2 Glasses of wine per week  . Drug Use: No  . Sexually Active: Not on file   Other Topics Concern  . 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 Aortic valve replacement 07/28/2007    #21 Edwards pericardial valve model 2700  . Inguinal hernia repair     left  . Tonsillectomy   . Prostate surgery     transurethral resection of prostate  . Esophagogastroduodenoscopy 05/08/2003  . Total hip arthroplasty     left hip repair  . Cystectomy 03/18/10    radical  . Robot assisted laparoscopic complete cystect ileal conduit 03/18/10    ileal conduit urinary diversion  . Colonoscopy 05/08/2003    Family History  Problem Relation Age of Onset  . Lung cancer Mother   . Cancer Mother     Lung  . Prostate cancer Father   . Cancer Father     prostate  . Tuberculosis Paternal Grandfather     patient lived with and was exposed to-wa in the [facility] sent to [facility]    No Known Allergies  Current Outpatient Prescriptions on File Prior  to Visit  Medication Sig Dispense Refill  . acetaminophen (TYLENOL) 500 MG tablet Take 1,000 mg by mouth every 2 (two) hours as needed. For hip pain      . ALPHA-LIPOIC ACID PO Take 1 capsule by mouth daily.        . B Complex-C (B-COMPLEX WITH VITAMIN C) tablet Take 1 tablet by mouth daily.        Marland Kitchen BETA CAROTENE PO Take 1 tablet by mouth daily.        . beta carotene w/minerals (OCUVITE) tablet Take 1 tablet by mouth daily.      . budesonide-formoterol (SYMBICORT) 160-4.5 MCG/ACT inhaler Inhale 2 puffs into the lungs 2 (two) times daily.      . calcium carbonate (OS-CAL) 600 MG TABS Take 1,200 mg by mouth 2 (two) times daily with a meal.       . Calcium-Magnesium-Zinc 167-83-8 MG TABS Take 1 tablet by mouth daily.      Marland Kitchen co-enzyme Q-10 30 MG capsule Take 30 mg by mouth daily.        . ferrous sulfate 325 (65 FE) MG tablet Take 325 mg by mouth 3 (three) times daily with meals.       . furosemide (LASIX) 40 MG tablet Take 1 tablet (40 mg total) by  mouth daily.  30 tablet  3  . Ginkgo Biloba (GINKOBA) 40 MG TABS Take 1 tablet by mouth daily.        . Ginseng 250 MG CAPS Take 1 capsule by mouth daily.        Marland Kitchen HYDROcodone-acetaminophen (VICODIN) 5-500 MG per tablet Take 1-2 tablets by mouth every 6 (six) hours as needed for pain.  20 tablet  0  . ibuprofen (ADVIL,MOTRIN) 200 MG tablet Take 400 mg by mouth every 8 (eight) hours as needed. For pain      . Lactobacillus (ACIDOPHILUS) CAPS Take 1 capsule by mouth daily.       Marland Kitchen levothyroxine (SYNTHROID, LEVOTHROID) 100 MCG tablet TAKE 1 TABLET BY MOUTH DAILY  30 tablet  3  . Melatonin 3 MG TABS Take 1 tablet by mouth at bedtime.       . Multiple Vitamin (MULTIVITAMIN WITH MINERALS) TABS Take 1 tablet by mouth daily.      . Multiple Vitamin (MULTIVITAMIN) tablet Take 1 tablet by mouth daily.      Marland Kitchen NEXIUM 40 MG capsule TAKE ONE CAPSULE EVERY DAY 30 MINUTES BEFORE BREAKFAST  30 capsule  3  . Nutritional Supplements (DHEA PO) Take 1 tablet by mouth at bedtime.      Docia Barrier IN Inhale into the lungs Nightly.      . Selenium (SELENIMIN-200) 200 MCG TABS Take 1 tablet by mouth daily.        . simvastatin (ZOCOR) 10 MG tablet TAKE 1 TABLET BY MOUTH EVERY DAY  30 tablet  3  . albuterol (PROVENTIL HFA;VENTOLIN HFA) 108 (90 BASE) MCG/ACT inhaler Inhale 2 puffs into the lungs every 6 (six) hours as needed for wheezing.  1 Inhaler  1    BP 130/70  Pulse 59  Temp 97.3 F (36.3 C) (Oral)  Resp 16  Ht 5\' 11"  (1.803 m)  Wt 190 lb (86.183 kg)  BMI 26.50 kg/m2  SpO2 99%       Objective:   Physical Exam  Constitutional: He is oriented to person, place, and time. He appears well-developed and well-nourished. No distress.  Cardiovascular: Normal rate and regular rhythm.   No  murmur heard. Pulmonary/Chest: Effort normal and breath sounds normal. No respiratory distress. He has no wheezes. He has no rales. He exhibits no tenderness.  Musculoskeletal: He exhibits no edema.  Neurological: He is  alert and oriented to person, place, and time.  Skin: Skin is warm and dry.  Psychiatric: He has a normal mood and affect. His behavior is normal. Judgment and thought content normal.          Assessment & Plan:

## 2012-06-23 NOTE — Assessment & Plan Note (Signed)
Repeat CBC, continue iron supplement.

## 2012-06-25 ENCOUNTER — Encounter: Payer: Self-pay | Admitting: Family

## 2012-07-03 ENCOUNTER — Other Ambulatory Visit (INDEPENDENT_AMBULATORY_CARE_PROVIDER_SITE_OTHER): Payer: Self-pay | Admitting: Surgery

## 2012-07-03 ENCOUNTER — Encounter: Payer: Self-pay | Admitting: Pulmonary Disease

## 2012-07-04 ENCOUNTER — Other Ambulatory Visit: Payer: Self-pay | Admitting: Internal Medicine

## 2012-07-04 ENCOUNTER — Other Ambulatory Visit: Payer: Self-pay | Admitting: Urology

## 2012-07-04 DIAGNOSIS — N1330 Unspecified hydronephrosis: Secondary | ICD-10-CM

## 2012-07-11 ENCOUNTER — Other Ambulatory Visit: Payer: Self-pay | Admitting: Radiology

## 2012-07-14 ENCOUNTER — Other Ambulatory Visit (HOSPITAL_COMMUNITY): Payer: Self-pay | Admitting: Interventional Radiology

## 2012-07-14 ENCOUNTER — Ambulatory Visit (HOSPITAL_COMMUNITY)
Admission: RE | Admit: 2012-07-14 | Discharge: 2012-07-14 | Disposition: A | Discharge status: Home / Self Care | Payer: Medicare Other | Source: Ambulatory Visit | Attending: Urology | Admitting: Urology

## 2012-07-14 ENCOUNTER — Other Ambulatory Visit: Payer: Self-pay | Admitting: Urology

## 2012-07-14 VITALS — BP 123/64 | HR 63 | Temp 97.8°F | Resp 18 | Ht 71.0 in | Wt 190.0 lb

## 2012-07-14 DIAGNOSIS — N1330 Unspecified hydronephrosis: Secondary | ICD-10-CM

## 2012-07-14 DIAGNOSIS — Z8042 Family history of malignant neoplasm of prostate: Secondary | ICD-10-CM | POA: Insufficient documentation

## 2012-07-14 DIAGNOSIS — Z8551 Personal history of malignant neoplasm of bladder: Secondary | ICD-10-CM | POA: Insufficient documentation

## 2012-07-14 DIAGNOSIS — Z801 Family history of malignant neoplasm of trachea, bronchus and lung: Secondary | ICD-10-CM | POA: Insufficient documentation

## 2012-07-14 DIAGNOSIS — J449 Chronic obstructive pulmonary disease, unspecified: Secondary | ICD-10-CM | POA: Insufficient documentation

## 2012-07-14 DIAGNOSIS — N289 Disorder of kidney and ureter, unspecified: Secondary | ICD-10-CM | POA: Insufficient documentation

## 2012-07-14 DIAGNOSIS — Z8546 Personal history of malignant neoplasm of prostate: Secondary | ICD-10-CM | POA: Insufficient documentation

## 2012-07-14 DIAGNOSIS — Z96649 Presence of unspecified artificial hip joint: Secondary | ICD-10-CM | POA: Insufficient documentation

## 2012-07-14 DIAGNOSIS — Z87891 Personal history of nicotine dependence: Secondary | ICD-10-CM | POA: Insufficient documentation

## 2012-07-14 DIAGNOSIS — Z79899 Other long term (current) drug therapy: Secondary | ICD-10-CM | POA: Insufficient documentation

## 2012-07-14 DIAGNOSIS — J4489 Other specified chronic obstructive pulmonary disease: Secondary | ICD-10-CM | POA: Insufficient documentation

## 2012-07-14 LAB — CBC
Platelets: 292 10*3/uL (ref 150–400)
RDW: 16.2 % — ABNORMAL HIGH (ref 11.5–15.5)
WBC: 8 10*3/uL (ref 4.0–10.5)

## 2012-07-14 LAB — BASIC METABOLIC PANEL
Calcium: 8.9 mg/dL (ref 8.4–10.5)
Chloride: 107 mEq/L (ref 96–112)
Creatinine, Ser: 2.78 mg/dL — ABNORMAL HIGH (ref 0.50–1.35)
GFR calc Af Amer: 22 mL/min — ABNORMAL LOW (ref >90)

## 2012-07-14 LAB — PROTIME-INR
INR: 1.04 (ref 0.00–1.49)
Prothrombin Time: 13.5 seconds (ref 11.6–15.2)

## 2012-07-14 LAB — APTT: aPTT: 45 seconds — ABNORMAL HIGH (ref 24–37)

## 2012-07-14 MED ORDER — CIPROFLOXACIN IN D5W 400 MG/200ML IV SOLN
400.0000 mg | INTRAVENOUS | Status: AC
  Administered 2012-07-14: 400 mg via INTRAVENOUS
  Filled 2012-07-14: qty 200

## 2012-07-14 MED ORDER — SODIUM CHLORIDE 0.9 % IV SOLN
INTRAVENOUS | Status: DC
  Administered 2012-07-14: 10:00:00 via INTRAVENOUS

## 2012-07-14 MED ORDER — LIDOCAINE HCL 1 % IJ SOLN
INTRAMUSCULAR | Status: AC
  Filled 2012-07-14: qty 20

## 2012-07-14 MED ORDER — FENTANYL CITRATE 0.05 MG/ML IJ SOLN
INTRAMUSCULAR | Status: AC
  Filled 2012-07-14: qty 8

## 2012-07-14 MED ORDER — FENTANYL CITRATE 0.05 MG/ML IJ SOLN
INTRAMUSCULAR | Status: DC | PRN
  Administered 2012-07-14 (×2): 50 ug via INTRAVENOUS

## 2012-07-14 MED ORDER — IOHEXOL 300 MG/ML  SOLN: 15.0000 mL | Freq: Once | INTRAMUSCULAR | Status: DC | PRN

## 2012-07-14 NOTE — Procedures (Signed)
Procedure:  Left percutaneous nephrostomy tube placement Findings:  Significant hydronephrosis.  Left 10 Fr PCN placed via upper pole access to renal pelvis. Plan:  Gravity drainage for next 4-6 days.  Attempt antegrade ureteral stenting next week.

## 2012-07-14 NOTE — H&P (Signed)
[name] is an 78 y.o. male.   Chief Complaint: "I'm here for a left kidney tube" HPI: Patient with history of bladder /prostate carcinoma, renal insufficiency and ileal conduit. Recent imaging has revealed left hydronephrosis/left ureteral stricture. Patient presents today for left percutaneous nephrostomy tube placement/nephrostogram for further assessment.  Past Medical History  Diagnosis Date  . History of prostate cancer     Dr Vonita Moss s/p surgery and radiation  . CHF (congestive heart failure)     preserved EF  . COPD (chronic obstructive pulmonary disease)   . Diverticulosis of colon   . History of pulmonary embolism   . Aortic stenosis   . ILD (interstitial lung disease)   . Carotid stenosis, bilateral     30% bilateral  . Urothelial carcinoma     of the bladder    Past Surgical History  Procedure Date  . Aortic valve replacement 07/28/2007    #21 Edwards pericardial valve model 2700  . Inguinal hernia repair     left  . Tonsillectomy   . Prostate surgery     transurethral resection of prostate  . Esophagogastroduodenoscopy 05/08/2003  . Total hip arthroplasty     left hip repair  . Cystectomy 03/18/10    radical  . Robot assisted laparoscopic complete cystect ileal conduit 03/18/10    ileal conduit urinary diversion  . Colonoscopy 05/08/2003    Family History  Problem Relation Age of Onset  . Lung cancer Mother   . Cancer Mother     Lung  . Prostate cancer Father   . Cancer Father     prostate  . Tuberculosis Paternal Grandfather     patient lived with and was exposed to-wa in the [employer] sent to [facility]   Social History:  reports that he quit smoking about 30 years ago. His smoking use included Cigarettes. He has a 60 pack-year smoking history. He has never used smokeless tobacco. He reports that he drinks about .6 - 1.2 ounces of alcohol per week. He reports that he does not use illicit drugs.  Allergies: No Known Allergies   Current outpatient  prescriptions:acetaminophen (TYLENOL) 500 MG tablet, Take 1,000 mg by mouth every 2 (two) hours as needed. For hip pain, Disp: , Rfl: ;  albuterol (PROVENTIL HFA;VENTOLIN HFA) 108 (90 BASE) MCG/ACT inhaler, Inhale 2 puffs into the lungs every 6 (six) hours as needed for wheezing., Disp: 1 Inhaler, Rfl: 1;  ALPHA-LIPOIC ACID PO, Take 1 capsule by mouth daily.  , Disp: , Rfl:  B Complex-C (B-COMPLEX WITH VITAMIN C) tablet, Take 1 tablet by mouth daily.  , Disp: , Rfl: ;  BETA CAROTENE PO, Take 1 tablet by mouth daily.  , Disp: , Rfl: ;  beta carotene w/minerals (OCUVITE) tablet, Take 1 tablet by mouth daily., Disp: , Rfl: ;  budesonide-formoterol (SYMBICORT) 160-4.5 MCG/ACT inhaler, Inhale 2 puffs into the lungs 2 (two) times daily., Disp: , Rfl:  calcium carbonate (OS-CAL) 600 MG TABS, Take 1,200 mg by mouth 2 (two) times daily with a meal. , Disp: , Rfl: ;  Calcium-Magnesium-Zinc 167-83-8 MG TABS, Take 1 tablet by mouth daily., Disp: , Rfl: ;  co-enzyme Q-10 30 MG capsule, Take 30 mg by mouth daily.  , Disp: , Rfl: ;  ferrous sulfate 325 (65 FE) MG tablet, Take 325 mg by mouth 3 (three) times daily with meals. , Disp: , Rfl:  furosemide (LASIX) 40 MG tablet, Take 1 tablet (40 mg total) by mouth daily., Disp: 30 tablet,  Rfl: 3;  Ginkgo Biloba (GINKOBA) 40 MG TABS, Take 1 tablet by mouth daily.  , Disp: , Rfl: ;  Ginseng 250 MG CAPS, Take 1 capsule by mouth daily.  , Disp: , Rfl: ;  HYDROcodone-acetaminophen (VICODIN) 5-500 MG per tablet, Take 1-2 tablets by mouth every 6 (six) hours as needed for pain., Disp: 20 tablet, Rfl: 0 ibuprofen (ADVIL,MOTRIN) 200 MG tablet, Take 400 mg by mouth every 8 (eight) hours as needed. For pain, Disp: , Rfl: ;  Lactobacillus (ACIDOPHILUS) CAPS, Take 1 capsule by mouth daily. , Disp: , Rfl: ;  levothyroxine (SYNTHROID, LEVOTHROID) 100 MCG tablet, TAKE 1 TABLET BY MOUTH DAILY, Disp: 30 tablet, Rfl: 3;  Melatonin 3 MG TABS, Take 1 tablet by mouth at bedtime. , Disp: , Rfl:   Multiple Vitamin (MULTIVITAMIN WITH MINERALS) TABS, Take 1 tablet by mouth daily., Disp: , Rfl: ;  Multiple Vitamin (MULTIVITAMIN) tablet, Take 1 tablet by mouth daily., Disp: , Rfl: ;  NEXIUM 40 MG capsule, TAKE ONE CAPSULE EVERY DAY 30 MINUTES BEFORE BREAKFAST, Disp: 30 capsule, Rfl: 3;  Nutritional Supplements (DHEA PO), Take 1 tablet by mouth at bedtime., Disp: , Rfl: ;  OXYGEN-HELIUM IN, Inhale into the lungs Nightly., Disp: , Rfl:  Selenium (SELENIMIN-200) 200 MCG TABS, Take 1 tablet by mouth daily.  , Disp: , Rfl: ;  simvastatin (ZOCOR) 10 MG tablet, TAKE 1 TABLET BY MOUTH EVERY DAY, Disp: 30 tablet, Rfl: 3 Current facility-administered medications:0.9 %  sodium chloride infusion, , Intravenous, Continuous, D Kevin Allred, PA;  ciprofloxacin (CIPRO) IVPB 400 mg, 400 mg, Intravenous, On Call, D Jeananne Rama, PA  Results for orders placed during the hospital encounter of 07/14/12 (from the past 48 hour(s))  APTT     Status: Abnormal   Collection Time   07/14/12  8:00 AM      Component Value Range Comment   aPTT 45 (*) 24 - 37 seconds   CBC     Status: Abnormal   Collection Time   07/14/12  8:00 AM      Component Value Range Comment   WBC 8.0  4.0 - 10.5 K/uL    RBC 3.48 (*) 4.22 - 5.81 MIL/uL    Hemoglobin 10.0 (*) 13.0 - 17.0 g/dL    HCT 82.9 (*) 56.2 - 52.0 %    MCV 88.8  78.0 - 100.0 fL    MCH 28.7  26.0 - 34.0 pg    MCHC 32.4  30.0 - 36.0 g/dL    RDW 13.0 (*) 86.5 - 15.5 %    Platelets 292  150 - 400 K/uL   PROTIME-INR     Status: Normal   Collection Time   07/14/12  8:00 AM      Component Value Range Comment   Prothrombin Time 13.5  11.6 - 15.2 seconds    INR 1.04  0.00 - 1.49    Results for orders placed during the hospital encounter of 07/14/12  APTT      Component Value Range   aPTT 45 (*) 24 - 37 seconds  BASIC METABOLIC PANEL      Component Value Range   Sodium 141  135 - 145 mEq/L   Potassium 4.5  3.5 - 5.1 mEq/L   Chloride 107  96 - 112 mEq/L   CO2 23  19 - 32  mEq/L   Glucose, Bld 94  70 - 99 mg/dL   BUN 58 (*) 6 - 23 mg/dL   Creatinine, Ser 7.84 (*)  0.50 - 1.35 mg/dL   Calcium 8.9  8.4 - 16.1 mg/dL   GFR calc non Af Amer 19 (*) >90 mL/min   GFR calc Af Amer 22 (*) >90 mL/min  CBC      Component Value Range   WBC 8.0  4.0 - 10.5 K/uL   RBC 3.48 (*) 4.22 - 5.81 MIL/uL   Hemoglobin 10.0 (*) 13.0 - 17.0 g/dL   HCT 09.6 (*) 04.5 - 40.9 %   MCV 88.8  78.0 - 100.0 fL   MCH 28.7  26.0 - 34.0 pg   MCHC 32.4  30.0 - 36.0 g/dL   RDW 81.1 (*) 91.4 - 78.2 %   Platelets 292  150 - 400 K/uL  PROTIME-INR      Component Value Range   Prothrombin Time 13.5  11.6 - 15.2 seconds   INR 1.04  0.00 - 1.49    Review of Systems  Constitutional: Negative for fever and chills.  Respiratory: Negative for cough and shortness of breath.   Cardiovascular: Negative for chest pain.  Gastrointestinal: Positive for blood in stool. Negative for nausea, vomiting and abdominal pain.  Musculoskeletal: Negative for back pain.  Neurological: Negative for headaches.    Blood pressure 120/68, pulse 63, temperature 97.6 F (36.4 C), temperature source Oral, resp. rate 18, height 5\' 11"  (1.803 m), weight 190 lb (86.183 kg), SpO2 100.00%. Physical Exam  Constitutional: He is oriented to person, place, and time. He appears well-developed and well-nourished.  Cardiovascular: Normal rate and regular rhythm.   Respiratory: Effort normal.       Sl dim BS bases, right >left  GI: Soft. Bowel sounds are normal. There is no tenderness.       Stoma -ileal conduit   Musculoskeletal: Normal range of motion. He exhibits edema.  Neurological: He is alert and oriented to person, place, and time.     Assessment/Plan Patient with history of bladder /prostate carcinoma with urinary diversion/ileal conduit, renal insufficiency and recent imaging revealing left hydronephrosis/left ureteral stricture. Plan is for left percutaneous nephrostomy/nephrostogram today for further assessment.  Details/risks of procedure d/w pt/son with their understanding and consent.   ALLRED,D KEVIN 07/14/2012, 8:48 AM

## 2012-07-14 NOTE — H&P (Signed)
Agree 

## 2012-07-18 ENCOUNTER — Other Ambulatory Visit: Payer: Self-pay | Admitting: Radiology

## 2012-07-20 ENCOUNTER — Other Ambulatory Visit (HOSPITAL_COMMUNITY): Payer: Self-pay | Admitting: Interventional Radiology

## 2012-07-20 ENCOUNTER — Ambulatory Visit (HOSPITAL_COMMUNITY)
Admission: RE | Admit: 2012-07-20 | Discharge: 2012-07-20 | Disposition: A | Discharge status: Home / Self Care | Payer: Medicare Other | Source: Ambulatory Visit | Attending: Interventional Radiology | Admitting: Interventional Radiology

## 2012-07-20 DIAGNOSIS — C679 Malignant neoplasm of bladder, unspecified: Secondary | ICD-10-CM | POA: Insufficient documentation

## 2012-07-20 DIAGNOSIS — N1330 Unspecified hydronephrosis: Secondary | ICD-10-CM | POA: Insufficient documentation

## 2012-07-20 DIAGNOSIS — N135 Crossing vessel and stricture of ureter without hydronephrosis: Secondary | ICD-10-CM | POA: Insufficient documentation

## 2012-07-20 DIAGNOSIS — Z436 Encounter for attention to other artificial openings of urinary tract: Secondary | ICD-10-CM | POA: Insufficient documentation

## 2012-07-20 LAB — BASIC METABOLIC PANEL
BUN: 60 mg/dL — ABNORMAL HIGH (ref 6–23)
Chloride: 106 mEq/L (ref 96–112)
Creatinine, Ser: 2.45 mg/dL — ABNORMAL HIGH (ref 0.50–1.35)
GFR calc Af Amer: 25 mL/min — ABNORMAL LOW (ref >90)
Glucose, Bld: 110 mg/dL — ABNORMAL HIGH (ref 70–99)

## 2012-07-20 LAB — CBC WITH DIFFERENTIAL/PLATELET
Basophils Absolute: 0 10*3/uL (ref 0.0–0.1)
Basophils Relative: 0 % (ref 0–1)
Eosinophils Absolute: 0.2 10*3/uL (ref 0.0–0.7)
Eosinophils Relative: 3 % (ref 0–5)
HCT: 32.8 % — ABNORMAL LOW (ref 39.0–52.0)
MCH: 29 pg (ref 26.0–34.0)
MCHC: 32.9 g/dL (ref 30.0–36.0)
Monocytes Absolute: 0.7 10*3/uL (ref 0.1–1.0)
Neutro Abs: 5.2 10*3/uL (ref 1.7–7.7)
RDW: 16 % — ABNORMAL HIGH (ref 11.5–15.5)

## 2012-07-20 MED ORDER — FENTANYL CITRATE 0.05 MG/ML IJ SOLN
INTRAMUSCULAR | Status: AC
  Filled 2012-07-20: qty 6

## 2012-07-20 MED ORDER — FENTANYL CITRATE 0.05 MG/ML IJ SOLN
INTRAMUSCULAR | Status: AC | PRN
  Administered 2012-07-20: 100 ug via INTRAVENOUS

## 2012-07-20 MED ORDER — IOHEXOL 300 MG/ML  SOLN
20.0000 mL | Freq: Once | INTRAMUSCULAR | Status: AC | PRN
  Administered 2012-07-20: 20 mL

## 2012-07-20 MED ORDER — MIDAZOLAM HCL 2 MG/2ML IJ SOLN
INTRAMUSCULAR | Status: AC
  Filled 2012-07-20: qty 6

## 2012-07-20 MED ORDER — LIDOCAINE HCL (PF) 1 % IJ SOLN
INTRAMUSCULAR | Status: AC
  Filled 2012-07-20: qty 30

## 2012-07-20 MED ORDER — CIPROFLOXACIN IN D5W 400 MG/200ML IV SOLN
400.0000 mg | Freq: Once | INTRAVENOUS | Status: AC
  Administered 2012-07-20: 400 mg via INTRAVENOUS
  Filled 2012-07-20: qty 200

## 2012-07-20 MED ORDER — MIDAZOLAM HCL 2 MG/2ML IJ SOLN
INTRAMUSCULAR | Status: AC | PRN
  Administered 2012-07-20: 1 mg via INTRAVENOUS

## 2012-07-20 MED ORDER — SODIUM CHLORIDE 0.9 % IV SOLN: INTRAVENOUS | Status: DC

## 2012-07-20 NOTE — ED Notes (Signed)
Fluids open

## 2012-07-20 NOTE — H&P (Signed)
[name] is an 78 y.o. male.   Chief Complaint: "I'm here for a left kidney stent" HPI: Patient with history of bladder /prostate carcinoma, renal insufficiency and ileal conduit. Recent imaging has revealed left hydronephrosis/left ureteral stricture. He was here last week and had (L)PCN placed. He has returned today for nephrostogram and attempt at nephroureteral stent. He has been well since his procedure last week.  Past Medical History  Diagnosis Date  . History of prostate cancer     Dr Vonita Moss s/p surgery and radiation  . CHF (congestive heart failure)     preserved EF  . COPD (chronic obstructive pulmonary disease)   . Diverticulosis of colon   . History of pulmonary embolism   . Aortic stenosis   . ILD (interstitial lung disease)   . Carotid stenosis, bilateral     30% bilateral  . Urothelial carcinoma     of the bladder    Past Surgical History  Procedure Date  . Aortic valve replacement 07/28/2007    #21 Edwards pericardial valve model 2700  . Inguinal hernia repair     left  . Tonsillectomy   . Prostate surgery     transurethral resection of prostate  . Esophagogastroduodenoscopy 05/08/2003  . Total hip arthroplasty     left hip repair  . Cystectomy 03/18/10    radical  . Robot assisted laparoscopic complete cystect ileal conduit 03/18/10    ileal conduit urinary diversion  . Colonoscopy 05/08/2003    Family History  Problem Relation Age of Onset  . Lung cancer Mother   . Cancer Mother     Lung  . Prostate cancer Father   . Cancer Father     prostate  . Tuberculosis Paternal Grandfather     patient lived with and was exposed to-wa in the [employer] sent to [facility]   Social History:  reports that he quit smoking about 30 years ago. His smoking use included Cigarettes. He has a 60 pack-year smoking history. He has never used smokeless tobacco. He reports that he drinks about .6 - 1.2 ounces of alcohol per week. He reports that he does not use illicit  drugs.  Allergies: No Known Allergies   Current outpatient prescriptions:albuterol (PROVENTIL HFA;VENTOLIN HFA) 108 (90 BASE) MCG/ACT inhaler, Inhale 3 puffs into the lungs every 4 (four) hours as needed. For shortness of breath., Disp: , Rfl: ;  B Complex-C (B-COMPLEX WITH VITAMIN C) tablet, Take 1 tablet by mouth every morning. , Disp: , Rfl: ;  esomeprazole (NEXIUM) 40 MG capsule, Take 40 mg by mouth every morning., Disp: , Rfl:  Ginkgo Biloba (GINKOBA) 40 MG TABS, Take 40 mg by mouth every morning. , Disp: , Rfl: ;  Ginseng 250 MG CAPS, Take 1 capsule by mouth every morning. , Disp: , Rfl: ;  ibuprofen (ADVIL,MOTRIN) 200 MG tablet, Take 600 mg by mouth at bedtime., Disp: , Rfl: ;  Lactobacillus (ACIDOPHILUS) CAPS, Take 2 capsules by mouth at bedtime. , Disp: , Rfl:  levothyroxine (SYNTHROID, LEVOTHROID) 100 MCG tablet, Take 100 mcg by mouth 2 (two) times daily., Disp: , Rfl: ;  Melatonin 3 MG TABS, Take 3 mg by mouth at bedtime. , Disp: , Rfl: ;  Multiple Vitamin (MULTIVITAMIN) tablet, Take 1 tablet by mouth every morning. , Disp: , Rfl: ;  Nutritional Supplements (DHEA PO), Take 1 tablet by mouth at bedtime., Disp: , Rfl:  Selenium (SELENIMIN-200) 200 MCG TABS, Take 200 mcg by mouth every morning. , Disp: , Rfl: ;  simvastatin (ZOCOR) 10 MG tablet, Take 10 mg by mouth 2 (two) times daily., Disp: , Rfl: ;  ALPHA-LIPOIC ACID PO, Take 1 capsule by mouth daily.  , Disp: , Rfl: ;  BETA CAROTENE PO, Take 1 tablet by mouth daily.  , Disp: , Rfl: ;  beta carotene w/minerals (OCUVITE) tablet, Take 1 tablet by mouth daily., Disp: , Rfl:  budesonide-formoterol (SYMBICORT) 160-4.5 MCG/ACT inhaler, Inhale 2 puffs into the lungs 2 (two) times daily., Disp: , Rfl: ;  calcium carbonate (OS-CAL) 600 MG TABS, Take 1,200 mg by mouth 2 (two) times daily with a meal. , Disp: , Rfl: ;  Calcium-Magnesium-Zinc 167-83-8 MG TABS, Take 1 tablet by mouth daily., Disp: , Rfl: ;  co-enzyme Q-10 30 MG capsule, Take 30 mg by mouth  daily.  , Disp: , Rfl:  ferrous sulfate 325 (65 FE) MG tablet, Take 325 mg by mouth 3 (three) times daily with meals. , Disp: , Rfl: ;  furosemide (LASIX) 40 MG tablet, Take 1 tablet (40 mg total) by mouth daily., Disp: 30 tablet, Rfl: 3;  [DISCONTINUED] albuterol (PROVENTIL HFA;VENTOLIN HFA) 108 (90 BASE) MCG/ACT inhaler, Inhale 2 puffs into the lungs every 6 (six) hours as needed for wheezing., Disp: 1 Inhaler, Rfl: 1 Current facility-administered medications:0.9 %  sodium chloride infusion, , Intravenous, Continuous, Brayton El, PA;  ciprofloxacin (CIPRO) IVPB 400 mg, 400 mg, Intravenous, Once, Brayton El, PA  Results for orders placed during the hospital encounter of 07/20/12 (from the past 48 hour(s))  APTT     Status: Abnormal   Collection Time   07/20/12 12:28 PM      Component Value Range Comment   aPTT 48 (*) 24 - 37 seconds   CBC WITH DIFFERENTIAL     Status: Abnormal   Collection Time   07/20/12 12:28 PM      Component Value Range Comment   WBC 7.3  4.0 - 10.5 K/uL    RBC 3.73 (*) 4.22 - 5.81 MIL/uL    Hemoglobin 10.8 (*) 13.0 - 17.0 g/dL    HCT 16.1 (*) 09.6 - 52.0 %    MCV 87.9  78.0 - 100.0 fL    MCH 29.0  26.0 - 34.0 pg    MCHC 32.9  30.0 - 36.0 g/dL    RDW 04.5 (*) 40.9 - 15.5 %    Platelets 295  150 - 400 K/uL    Neutrophils Relative 72  43 - 77 %    Neutro Abs 5.2  1.7 - 7.7 K/uL    Lymphocytes Relative 17  12 - 46 %    Lymphs Abs 1.2  0.7 - 4.0 K/uL    Monocytes Relative 9  3 - 12 %    Monocytes Absolute 0.7  0.1 - 1.0 K/uL    Eosinophils Relative 3  0 - 5 %    Eosinophils Absolute 0.2  0.0 - 0.7 K/uL    Basophils Relative 0  0 - 1 %    Basophils Absolute 0.0  0.0 - 0.1 K/uL   PROTIME-INR     Status: Normal   Collection Time   07/20/12 12:28 PM      Component Value Range Comment   Prothrombin Time 14.0  11.6 - 15.2 seconds    INR 1.09  0.00 - 1.49    Results for orders placed during the hospital encounter of 07/20/12  APTT      Component Value Range    aPTT 48 (*) 24 - 37  seconds  CBC WITH DIFFERENTIAL      Component Value Range   WBC 7.3  4.0 - 10.5 K/uL   RBC 3.73 (*) 4.22 - 5.81 MIL/uL   Hemoglobin 10.8 (*) 13.0 - 17.0 g/dL   HCT 16.1 (*) 09.6 - 04.5 %   MCV 87.9  78.0 - 100.0 fL   MCH 29.0  26.0 - 34.0 pg   MCHC 32.9  30.0 - 36.0 g/dL   RDW 40.9 (*) 81.1 - 91.4 %   Platelets 295  150 - 400 K/uL   Neutrophils Relative 72  43 - 77 %   Neutro Abs 5.2  1.7 - 7.7 K/uL   Lymphocytes Relative 17  12 - 46 %   Lymphs Abs 1.2  0.7 - 4.0 K/uL   Monocytes Relative 9  3 - 12 %   Monocytes Absolute 0.7  0.1 - 1.0 K/uL   Eosinophils Relative 3  0 - 5 %   Eosinophils Absolute 0.2  0.0 - 0.7 K/uL   Basophils Relative 0  0 - 1 %   Basophils Absolute 0.0  0.0 - 0.1 K/uL  PROTIME-INR      Component Value Range   Prothrombin Time 14.0  11.6 - 15.2 seconds   INR 1.09  0.00 - 1.49    Review of Systems  Constitutional: Negative for fever and chills.  Respiratory: Negative for cough and shortness of breath.   Cardiovascular: Negative for chest pain.  Gastrointestinal:  Negative for nausea, vomiting and abdominal pain.  Musculoskeletal: Negative for back pain.  Neurological: Negative for headaches.    Blood pressure 104/61, pulse 62, temperature 97.4 F (36.3 C), temperature source Oral, resp. rate 18, SpO2 98.00%. Physical Exam  Constitutional: He is oriented to person, place, and time. He appears well-developed and well-nourished.  Cardiovascular: Normal rate and regular rhythm.   Respiratory: Effort normal. CTA  GI: Soft. Bowel sounds are normal. There is no tenderness.       Stoma -ileal conduit  Musculoskeletal: Normal range of motion.  Back:(L)PCN intact, site clean, NT, amber UOP Neurological: He is alert and oriented to person, place, and time.     Assessment/Plan Patient with history of bladder /prostate carcinoma with urinary diversion/ileal conduit, renal insufficiency and recent imaging revealing left hydronephrosis/left  ureteral stricture. Plan is for left nephrostogram and attempt at ureteral stenting today. Details/risks of procedure d/w pt/son with their understanding and consent.   Brayton El 07/20/2012, 1:16 PM

## 2012-07-20 NOTE — Procedures (Signed)
Exchange Left nephrostomy for 47F retrograde nephroureteral catheter Sample sent for cytology No complication No blood loss. See complete dictation in [facility].

## 2012-07-23 ENCOUNTER — Other Ambulatory Visit: Payer: Self-pay | Admitting: Internal Medicine

## 2012-07-24 ENCOUNTER — Ambulatory Visit: Payer: Medicare Other | Admitting: Family

## 2012-07-26 ENCOUNTER — Other Ambulatory Visit: Payer: Self-pay | Admitting: Urology

## 2012-07-26 DIAGNOSIS — N1330 Unspecified hydronephrosis: Secondary | ICD-10-CM

## 2012-08-02 ENCOUNTER — Other Ambulatory Visit: Payer: Self-pay | Admitting: Internal Medicine

## 2012-08-03 NOTE — Telephone Encounter (Signed)
Rx to pharmacy/SLS 

## 2012-08-18 ENCOUNTER — Other Ambulatory Visit: Payer: Self-pay | Admitting: Internal Medicine

## 2012-08-22 ENCOUNTER — Other Ambulatory Visit: Payer: Self-pay | Admitting: Internal Medicine

## 2012-08-31 ENCOUNTER — Ambulatory Visit (HOSPITAL_COMMUNITY)
Admission: RE | Admit: 2012-08-31 | Discharge: 2012-08-31 | Disposition: A | Discharge status: Home / Self Care | Payer: Medicare Other | Source: Ambulatory Visit | Attending: Urology | Admitting: Urology

## 2012-08-31 ENCOUNTER — Other Ambulatory Visit: Payer: Self-pay | Admitting: Urology

## 2012-08-31 DIAGNOSIS — C679 Malignant neoplasm of bladder, unspecified: Secondary | ICD-10-CM | POA: Insufficient documentation

## 2012-08-31 DIAGNOSIS — N135 Crossing vessel and stricture of ureter without hydronephrosis: Secondary | ICD-10-CM | POA: Insufficient documentation

## 2012-08-31 DIAGNOSIS — N1330 Unspecified hydronephrosis: Secondary | ICD-10-CM

## 2012-08-31 MED ORDER — IOHEXOL 300 MG/ML  SOLN
10.0000 mL | Freq: Once | INTRAMUSCULAR | Status: AC | PRN
  Administered 2012-08-31: 10 mL

## 2012-09-11 ENCOUNTER — Telehealth: Payer: Self-pay | Admitting: Pulmonary Disease

## 2012-09-11 NOTE — Telephone Encounter (Signed)
Attempted to call patient 3x to schedule a follow up appointment. No return call back. Sent letter 09/11/12 °

## 2012-09-27 ENCOUNTER — Ambulatory Visit: Payer: Medicare Other | Admitting: Family

## 2012-10-02 ENCOUNTER — Encounter: Payer: Self-pay | Admitting: Family

## 2012-10-02 ENCOUNTER — Ambulatory Visit (INDEPENDENT_AMBULATORY_CARE_PROVIDER_SITE_OTHER): Payer: Medicare Other | Admitting: Family

## 2012-10-02 VITALS — BP 100/70 | HR 60 | Temp 97.4°F | Resp 16 | Ht 71.0 in | Wt 194.0 lb

## 2012-10-02 DIAGNOSIS — I10 Essential (primary) hypertension: Secondary | ICD-10-CM

## 2012-10-02 DIAGNOSIS — D509 Iron deficiency anemia, unspecified: Secondary | ICD-10-CM

## 2012-10-02 DIAGNOSIS — E039 Hypothyroidism, unspecified: Secondary | ICD-10-CM

## 2012-10-02 DIAGNOSIS — E785 Hyperlipidemia, unspecified: Secondary | ICD-10-CM

## 2012-10-02 LAB — CBC WITH DIFFERENTIAL/PLATELET
Eosinophils Relative: 2 % (ref 0–5)
HCT: 31.9 % — ABNORMAL LOW (ref 39.0–52.0)
Hemoglobin: 10.6 g/dL — ABNORMAL LOW (ref 13.0–17.0)
Lymphocytes Relative: 20 % (ref 12–46)
Lymphs Abs: 1.4 10*3/uL (ref 0.7–4.0)
MCV: 85.3 fL (ref 78.0–100.0)
Monocytes Absolute: 0.7 10*3/uL (ref 0.1–1.0)
Monocytes Relative: 10 % (ref 3–12)
Neutro Abs: 4.8 10*3/uL (ref 1.7–7.7)
WBC: 7.1 10*3/uL (ref 4.0–10.5)

## 2012-10-02 NOTE — Progress Notes (Signed)
Subjective:    Patient ID: [name], male    DOB: 09-24-1920, 79 y.o.   MRN: [id number]  HPI  [name] is an 79 yr old male who presents today for follow up.  1) Anemia-Reports that he has been taking iron. Denies visible hematuria.   2) HTN- The pt is not currently on BP medication.    3) Hyperlipidemia- Pt is maintained on simvastatin.  Denies myalgia.   4) Hypothyroid- He continues levothyroxine.    5) Hx blader CA- pt reports that he had a "blockage" from the left kidney and that he had a stent placed.  He is scheduled for follow up in April with urology and will follow up sooner with radiology for stent exchange.     Review of Systems See HPI  Past Medical History  Diagnosis Date  . History of prostate cancer     Dr [name] s/p surgery and radiation  . CHF (congestive heart failure)     preserved EF  . COPD (chronic obstructive pulmonary disease)   . Diverticulosis of colon   . History of pulmonary embolism   . Aortic stenosis   . ILD (interstitial lung disease)   . Carotid stenosis, bilateral     30% bilateral  . Urothelial carcinoma     of the bladder    History   Social History  . Marital Status: Widowed    Spouse Name: N/A    Number of Children: 5  . Years of Education: N/A   Occupational History  . retired    Social History Main Topics  . Smoking status: Former Smoker -- 2.00 packs/day for 30 years    Types: Cigarettes    Quit date: 08/09/1981  . Smokeless tobacco: Never Used     Comment: 15-20 years ago (80 pk year history)  . Alcohol Use: .6 - 1.2 oz/week    1-2 Glasses of wine per week  . Drug Use: No  . Sexually Active: Not on file   Other Topics Concern  . Not on file   Social History Narrative   Retired Pensions consultant - now Tree surgeon    Widowed - lives alone     4 living children      Alcohol use-yes (glass of wine per day)    Former Smoker q 15-20 yrs ago (80 pk year history)         Past Surgical History  Procedure  Laterality Date  . Aortic valve replacement  07/28/2007    #21 Edwards pericardial valve model 2700  . Inguinal hernia repair      left  . Tonsillectomy    . Prostate surgery      transurethral resection of prostate  . Esophagogastroduodenoscopy  05/08/2003  . Total hip arthroplasty      left hip repair  . Cystectomy  03/18/10    radical  . Robot assisted laparoscopic complete cystect ileal conduit  03/18/10    ileal conduit urinary diversion  . Colonoscopy  05/08/2003    Family History  Problem Relation Age of Onset  . Lung cancer Mother   . Cancer Mother     Lung  . Prostate cancer Father   . Cancer Father     prostate  . Tuberculosis Paternal Grandfather     patient lived with and was exposed to-wa in the [facility] sent to [facility]    No Known Allergies  Current Outpatient Prescriptions on File Prior to Visit  Medication Sig Dispense Refill  .  albuterol (PROVENTIL HFA;VENTOLIN HFA) 108 (90 BASE) MCG/ACT inhaler Inhale 3 puffs into the lungs every 4 (four) hours as needed. For shortness of breath.      . ALPHA-LIPOIC ACID PO Take 1 capsule by mouth every morning.       . B Complex-C (B-COMPLEX WITH VITAMIN C) tablet Take 1 tablet by mouth every morning.       Marland Kitchen BETA CAROTENE PO Take 1 tablet by mouth every morning.       . beta carotene w/minerals (OCUVITE) tablet Take 1 tablet by mouth every morning.       . budesonide-formoterol (SYMBICORT) 160-4.5 MCG/ACT inhaler Inhale 2 puffs into the lungs 2 (two) times daily.      . calcium carbonate (OS-CAL) 600 MG TABS Take 1,200 mg by mouth 2 (two) times daily with a meal.       . Calcium-Magnesium-Zinc 167-83-8 MG TABS Take 1 tablet by mouth every morning.       . Cholecalciferol (VITAMIN D PO) Take 1 tablet by mouth 2 (two) times daily.      Marland Kitchen CINNAMON PO Take 1 tablet by mouth every morning.      Marland Kitchen co-enzyme Q-10 30 MG capsule Take 30 mg by mouth 2 (two) times daily.       Marland Kitchen esomeprazole (NEXIUM) 40 MG capsule Take 40 mg by mouth  every morning.      . ferrous sulfate 325 (65 FE) MG tablet Take 325 mg by mouth 2 (two) times daily.       . furosemide (LASIX) 40 MG tablet TAKE 1 TABLET EVERY DAY  30 tablet  1  . Ginkgo Biloba (GINKOBA) 40 MG TABS Take 40 mg by mouth every morning.       . Ginseng 250 MG CAPS Take 250 mg by mouth every morning.       Marland Kitchen ibuprofen (ADVIL,MOTRIN) 200 MG tablet Take 600 mg by mouth at bedtime.      . Lactobacillus (ACIDOPHILUS) CAPS Take 2 capsules by mouth at bedtime.       Marland Kitchen levothyroxine (SYNTHROID, LEVOTHROID) 100 MCG tablet TAKE 1 TABLET BY MOUTH DAILY  30 tablet  2  . Melatonin 3 MG TABS Take 3 mg by mouth at bedtime.       . Multiple Vitamin (MULTIVITAMIN) tablet Take 1 tablet by mouth every morning.       . Nutritional Supplements (DHEA PO) Take 1 tablet by mouth at bedtime.      Marland Kitchen OVER THE COUNTER MEDICATION Take 1 tablet by mouth daily. Percolinate.      . Selenium (SELENIMIN-200) 200 MCG TABS Take 200 mcg by mouth every morning.       . simvastatin (ZOCOR) 10 MG tablet TAKE 1 TABLET BY MOUTH EVERY DAY  30 tablet  3   No current facility-administered medications on file prior to visit.    BP 100/70  Pulse 60  Temp(Src) 97.4 F (36.3 C) (Oral)  Resp 16  Ht 5\' 11"  (1.803 m)  Wt 194 lb 0.6 oz (88.016 kg)  BMI 27.08 kg/m2  SpO2 99%       Objective:   Physical Exam  Constitutional: He is oriented to person, place, and time. He appears well-developed and well-nourished. No distress.  HENT:  Head: Normocephalic and atraumatic.  Cardiovascular: Normal rate and regular rhythm.   No murmur heard. Pulmonary/Chest: Effort normal and breath sounds normal. No respiratory distress. He has no wheezes. He has no rales. He exhibits no tenderness.  Musculoskeletal: He exhibits no edema.  Lymphadenopathy:    He has no cervical adenopathy.  Neurological: He is alert and oriented to person, place, and time.  Psychiatric: He has a normal mood and affect. His behavior is normal. Judgment  and thought content normal.          Assessment & Plan:

## 2012-10-02 NOTE — Assessment & Plan Note (Signed)
Obtain CBC, iron levels, IFOB. Continue iron.

## 2012-10-02 NOTE — Assessment & Plan Note (Signed)
Clinically stable on synthroid. Cont same, check TSH.

## 2012-10-02 NOTE — Assessment & Plan Note (Signed)
Stable on statin, continue same. He should have flp next visit.

## 2012-10-02 NOTE — Assessment & Plan Note (Signed)
Repeat BMET today.  Likely related to recent ureteral blockage.  Hopefully creatinine will be trending downward now that he has had a stent placed.

## 2012-10-02 NOTE — Assessment & Plan Note (Signed)
BP Readings from Last 3 Encounters:  10/02/12 100/70  07/20/12 77/52  07/14/12 123/64  BP is stable.  Monitor.

## 2012-10-02 NOTE — Patient Instructions (Addendum)
Please complete your lab work prior to leaving. Complete stool kit and return. Follow up in 3 months.

## 2012-10-03 LAB — BASIC METABOLIC PANEL
CO2: 25 mEq/L (ref 19–32)
Calcium: 8.5 mg/dL (ref 8.4–10.5)
Glucose, Bld: 93 mg/dL (ref 70–99)
Potassium: 4.6 mEq/L (ref 3.5–5.3)
Sodium: 138 mEq/L (ref 135–145)

## 2012-10-03 LAB — TSH: TSH: 1.702 u[IU]/mL (ref 0.350–4.500)

## 2012-10-03 LAB — FERRITIN: Ferritin: 59 ng/mL (ref 22–322)

## 2012-10-04 ENCOUNTER — Other Ambulatory Visit: Payer: Self-pay | Admitting: Family

## 2012-10-04 ENCOUNTER — Other Ambulatory Visit: Payer: Self-pay | Admitting: *Deleted

## 2012-10-04 MED ORDER — FUROSEMIDE 40 MG PO TABS: ORAL_TABLET | ORAL | Status: DC

## 2012-10-11 ENCOUNTER — Other Ambulatory Visit: Payer: Medicare Other

## 2012-10-11 DIAGNOSIS — D509 Iron deficiency anemia, unspecified: Secondary | ICD-10-CM

## 2012-10-12 ENCOUNTER — Ambulatory Visit (HOSPITAL_COMMUNITY): Payer: Medicare Other

## 2012-10-13 ENCOUNTER — Ambulatory Visit (HOSPITAL_COMMUNITY): Payer: Medicare Other

## 2012-10-16 ENCOUNTER — Ambulatory Visit (HOSPITAL_COMMUNITY)
Admission: RE | Admit: 2012-10-16 | Discharge: 2012-10-16 | Disposition: A | Discharge status: Home / Self Care | Payer: Medicare Other | Source: Ambulatory Visit | Attending: Urology | Admitting: Urology

## 2012-10-16 ENCOUNTER — Other Ambulatory Visit: Payer: Self-pay | Admitting: Urology

## 2012-10-16 DIAGNOSIS — C679 Malignant neoplasm of bladder, unspecified: Secondary | ICD-10-CM | POA: Insufficient documentation

## 2012-10-16 DIAGNOSIS — Z466 Encounter for fitting and adjustment of urinary device: Secondary | ICD-10-CM | POA: Insufficient documentation

## 2012-10-16 DIAGNOSIS — N1330 Unspecified hydronephrosis: Secondary | ICD-10-CM

## 2012-10-16 MED ORDER — IOHEXOL 300 MG/ML  SOLN
10.0000 mL | Freq: Once | INTRAMUSCULAR | Status: AC | PRN
  Administered 2012-10-16: 10 mL

## 2012-10-16 NOTE — Procedures (Signed)
Successful fluoroscopic guided exchange of retrograde nephro-ureteral catheter.  No immediate post procedural complications.

## 2012-10-21 ENCOUNTER — Other Ambulatory Visit: Payer: Self-pay | Admitting: Family

## 2012-10-23 NOTE — Telephone Encounter (Signed)
Rx to pharmacy/SLS 

## 2012-10-24 ENCOUNTER — Other Ambulatory Visit: Payer: Self-pay | Admitting: Family

## 2012-10-24 NOTE — Telephone Encounter (Signed)
Denied levothyroxine request as refill last sent on 10/21/12 and note to pharmacy to use Rx on file.

## 2012-11-09 ENCOUNTER — Other Ambulatory Visit: Payer: Self-pay | Admitting: Family

## 2012-11-24 ENCOUNTER — Other Ambulatory Visit: Payer: Self-pay | Admitting: Urology

## 2012-11-24 DIAGNOSIS — N1330 Unspecified hydronephrosis: Secondary | ICD-10-CM

## 2012-11-27 ENCOUNTER — Ambulatory Visit (HOSPITAL_COMMUNITY): Admission: RE | Admit: 2012-11-27 | Payer: Medicare Other | Source: Ambulatory Visit

## 2012-11-27 ENCOUNTER — Encounter (HOSPITAL_COMMUNITY): Payer: Self-pay | Admitting: Radiology

## 2012-11-27 ENCOUNTER — Other Ambulatory Visit (HOSPITAL_COMMUNITY): Payer: TRICARE For Life (TFL)

## 2012-12-01 ENCOUNTER — Ambulatory Visit (HOSPITAL_COMMUNITY): Payer: Medicare Other

## 2012-12-04 ENCOUNTER — Ambulatory Visit (HOSPITAL_COMMUNITY)
Admission: RE | Admit: 2012-12-04 | Discharge: 2012-12-04 | Disposition: A | Discharge status: Home / Self Care | Payer: Medicare Other | Source: Ambulatory Visit | Attending: Urology | Admitting: Urology

## 2012-12-04 DIAGNOSIS — Z436 Encounter for attention to other artificial openings of urinary tract: Secondary | ICD-10-CM | POA: Insufficient documentation

## 2012-12-04 DIAGNOSIS — N1330 Unspecified hydronephrosis: Secondary | ICD-10-CM

## 2012-12-04 MED ORDER — IOHEXOL 300 MG/ML  SOLN
10.0000 mL | Freq: Once | INTRAMUSCULAR | Status: AC | PRN
  Administered 2012-12-04: 10 mL

## 2012-12-04 NOTE — Procedures (Signed)
Successful left sided retrograde NUS.  No immediate complications.

## 2013-01-02 ENCOUNTER — Telehealth: Payer: Self-pay | Admitting: Oncology

## 2013-01-02 NOTE — Telephone Encounter (Signed)
C/D 01/02/13 for appt. 01/11/13

## 2013-01-02 NOTE — Telephone Encounter (Signed)
S/W PT IN RE NP APPT 06/05 @ 11:30 W/DR. SHADAD REFERRING DR. Retta Diones DX- PROBABLE RECURRENT BLADDER CA WELCOME PACKET MAILED.

## 2013-01-03 ENCOUNTER — Ambulatory Visit: Payer: TRICARE For Life (TFL) | Admitting: Family

## 2013-01-05 ENCOUNTER — Other Ambulatory Visit: Payer: Self-pay | Admitting: Oncology

## 2013-01-05 DIAGNOSIS — C679 Malignant neoplasm of bladder, unspecified: Secondary | ICD-10-CM

## 2013-01-10 ENCOUNTER — Ambulatory Visit (HOSPITAL_COMMUNITY)
Admission: RE | Admit: 2013-01-10 | Discharge: 2013-01-10 | Disposition: A | Discharge status: Home / Self Care | Payer: Medicare Other | Source: Ambulatory Visit | Attending: Urology | Admitting: Urology

## 2013-01-10 ENCOUNTER — Other Ambulatory Visit: Payer: Self-pay | Admitting: Urology

## 2013-01-10 DIAGNOSIS — Z906 Acquired absence of other parts of urinary tract: Secondary | ICD-10-CM | POA: Insufficient documentation

## 2013-01-10 DIAGNOSIS — N1330 Unspecified hydronephrosis: Secondary | ICD-10-CM

## 2013-01-10 DIAGNOSIS — C61 Malignant neoplasm of prostate: Secondary | ICD-10-CM | POA: Insufficient documentation

## 2013-01-10 DIAGNOSIS — Z9079 Acquired absence of other genital organ(s): Secondary | ICD-10-CM | POA: Insufficient documentation

## 2013-01-10 DIAGNOSIS — C679 Malignant neoplasm of bladder, unspecified: Secondary | ICD-10-CM | POA: Insufficient documentation

## 2013-01-10 DIAGNOSIS — Z436 Encounter for attention to other artificial openings of urinary tract: Secondary | ICD-10-CM | POA: Insufficient documentation

## 2013-01-10 DIAGNOSIS — Z932 Ileostomy status: Secondary | ICD-10-CM | POA: Insufficient documentation

## 2013-01-10 MED ORDER — IOHEXOL 300 MG/ML  SOLN
10.0000 mL | Freq: Once | INTRAMUSCULAR | Status: AC | PRN
  Administered 2013-01-10: 10 mL

## 2013-01-11 ENCOUNTER — Encounter: Payer: Self-pay | Admitting: Oncology

## 2013-01-11 ENCOUNTER — Ambulatory Visit: Payer: Medicare Other

## 2013-01-11 ENCOUNTER — Other Ambulatory Visit (HOSPITAL_BASED_OUTPATIENT_CLINIC_OR_DEPARTMENT_OTHER): Payer: Medicare Other | Admitting: Lab

## 2013-01-11 ENCOUNTER — Ambulatory Visit (HOSPITAL_BASED_OUTPATIENT_CLINIC_OR_DEPARTMENT_OTHER): Payer: Medicare Other | Admitting: Oncology

## 2013-01-11 ENCOUNTER — Telehealth: Payer: Self-pay | Admitting: Oncology

## 2013-01-11 VITALS — BP 110/70 | HR 77 | Temp 97.3°F | Resp 20 | Ht 66.5 in | Wt 187.2 lb

## 2013-01-11 DIAGNOSIS — C679 Malignant neoplasm of bladder, unspecified: Secondary | ICD-10-CM

## 2013-01-11 DIAGNOSIS — C772 Secondary and unspecified malignant neoplasm of intra-abdominal lymph nodes: Secondary | ICD-10-CM

## 2013-01-11 DIAGNOSIS — F411 Generalized anxiety disorder: Secondary | ICD-10-CM

## 2013-01-11 DIAGNOSIS — M949 Disorder of cartilage, unspecified: Secondary | ICD-10-CM

## 2013-01-11 LAB — COMPREHENSIVE METABOLIC PANEL (CC13)
AST: 32 U/L (ref 5–34)
Albumin: 2.8 g/dL — ABNORMAL LOW (ref 3.5–5.0)
BUN: 61.4 mg/dL — ABNORMAL HIGH (ref 7.0–26.0)
CO2: 19 mEq/L — ABNORMAL LOW (ref 22–29)
Calcium: 8.3 mg/dL — ABNORMAL LOW (ref 8.4–10.4)
Chloride: 108 mEq/L — ABNORMAL HIGH (ref 98–107)
Potassium: 4.1 mEq/L (ref 3.5–5.1)

## 2013-01-11 LAB — CBC WITH DIFFERENTIAL/PLATELET
Basophils Absolute: 0 10*3/uL (ref 0.0–0.1)
EOS%: 0.2 % (ref 0.0–7.0)
Eosinophils Absolute: 0 10*3/uL (ref 0.0–0.5)
HCT: 29.4 % — ABNORMAL LOW (ref 38.4–49.9)
HGB: 9.7 g/dL — ABNORMAL LOW (ref 13.0–17.1)
MCH: 28.5 pg (ref 27.2–33.4)
MONO#: 1.1 10*3/uL — ABNORMAL HIGH (ref 0.1–0.9)
NEUT#: 10.3 10*3/uL — ABNORMAL HIGH (ref 1.5–6.5)
RDW: 16.6 % — ABNORMAL HIGH (ref 11.0–14.6)
WBC: 12.1 10*3/uL — ABNORMAL HIGH (ref 4.0–10.3)
lymph#: 0.6 10*3/uL — ABNORMAL LOW (ref 0.9–3.3)

## 2013-01-11 MED ORDER — LORAZEPAM 0.5 MG PO TABS: 0.5000 mg | ORAL_TABLET | Freq: Three times a day (TID) | ORAL | Status: AC

## 2013-01-11 MED ORDER — HYDROCODONE-ACETAMINOPHEN 2.5-500 MG PO TABS: 1.0000 | ORAL_TABLET | Freq: Four times a day (QID) | ORAL | Status: AC | PRN

## 2013-01-11 NOTE — Progress Notes (Signed)
Reason for Referral: Bladder cancer  HPI: This is a pleasant 79 year old gentleman referred to me for the evaluation for possible advanced bladder cancer. He is a gentleman with past medical history of prostate cancer status post prostatectomy and radiation who developed hematuria dating back in 2011. At that time he was diagnosed with high grade papillary transitional cell carcinoma and underwent cystectomy and ileal conduit urinary diversion. That was done on 03/18/2010 with the pathology showing (case number [id number]) focally invasive urothelial carcinoma with diffuse lymphovascular invasion with changes consistent with radiation the pathological staging was T1 N0. Patient had been followed without really any major complication. He does have a left nephro ureteral catheter that has been placed. He had a most recently a scan done under the care of Dr. Lenn Sink on 12/27/2012 which showed retroperitoneal shoddy adenopathy the largest measuring 2.2 x 1.9 cm and interval development of bilateral pulmonary nodules suggesting metastatic disease. There is also interval development of scattered sclerotic lesions most notably at T10 and T11. For that reason patient was referred to me for evaluation.  Clinically he has been declining per his report and per his family's report. He stopped driving 6 weeks ago and have noted more episodic confusion. He does report some occasional hematuria as well as back pain. His back pain is predominantly lower back not associated with any neurological symptoms not associated with any loss or bowel or bladder function. It is graded as 3/10 sometimes up to 5/10 and relieved by hydrocodone and sometimes Tylenol. He does report some shortness of breath at times but he has chronic lung disease and he is oxygen dependent. He is able to ambulate with the help of a cane but he did have one fall in the last week. His appetite has not really changed and have not had any major weight loss.  Overall performance status have been declining and he is spending more more time less active. He still has a reasonable good quality of life and enjoys arts and painting.   Most recent laboratory testing from Dr. Deno Lunger office revealed that his PSA is 0.03 indicating that this is most likely advanced bladder cancer not prostate cancer.   Past Medical History  Diagnosis Date  . History of prostate cancer     Dr Vonita Moss s/p surgery and radiation  . CHF (congestive heart failure)     preserved EF  . COPD (chronic obstructive pulmonary disease)   . Diverticulosis of colon   . History of pulmonary embolism   . Aortic stenosis   . ILD (interstitial lung disease)   . Carotid stenosis, bilateral     30% bilateral  . Urothelial carcinoma     of the bladder  :  Past Surgical History  Procedure Laterality Date  . Aortic valve replacement  07/28/2007    #21 Edwards pericardial valve model 2700  . Inguinal hernia repair      left  . Tonsillectomy    . Prostate surgery      transurethral resection of prostate  . Esophagogastroduodenoscopy  05/08/2003  . Total hip arthroplasty      left hip repair  . Cystectomy  03/18/10    radical  . Robot assisted laparoscopic complete cystect ileal conduit  03/18/10    ileal conduit urinary diversion  . Colonoscopy  05/08/2003  :  Current Outpatient Prescriptions  Medication Sig Dispense Refill  . albuterol (PROVENTIL HFA;VENTOLIN HFA) 108 (90 BASE) MCG/ACT inhaler Inhale 3 puffs into the lungs every 4 (four)  hours as needed. For shortness of breath.      . ALPHA-LIPOIC ACID PO Take 1 capsule by mouth every morning.       . B Complex-C (B-COMPLEX WITH VITAMIN C) tablet Take 1 tablet by mouth every morning.       Marland Kitchen BETA CAROTENE PO Take 1 tablet by mouth every morning.       . beta carotene w/minerals (OCUVITE) tablet Take 1 tablet by mouth every morning.       . budesonide-formoterol (SYMBICORT) 160-4.5 MCG/ACT inhaler Inhale 2 puffs into the  lungs 2 (two) times daily.      . calcium carbonate (OS-CAL) 600 MG TABS Take 1,200 mg by mouth 2 (two) times daily with a meal.       . Calcium-Magnesium-Zinc 167-83-8 MG TABS Take 1 tablet by mouth every morning.       . Cholecalciferol (VITAMIN D PO) Take 1 tablet by mouth 2 (two) times daily.      Marland Kitchen CINNAMON PO Take 1 tablet by mouth every morning.      Marland Kitchen co-enzyme Q-10 30 MG capsule Take 30 mg by mouth 2 (two) times daily.       . ferrous sulfate 325 (65 FE) MG tablet Take 325 mg by mouth 2 (two) times daily.       . furosemide (LASIX) 40 MG tablet TAKE 1 TABLET EVERY DAY  30 tablet  4  . Ginkgo Biloba (GINKOBA) 40 MG TABS Take 40 mg by mouth every morning.       . Ginseng 250 MG CAPS Take 250 mg by mouth every morning.       Marland Kitchen HYDROcodone-acetaminophen (VICODIN) 2.5-500 MG per tablet Take 1 tablet by mouth every 6 (six) hours as needed for pain.  30 tablet  0  . ibuprofen (ADVIL,MOTRIN) 200 MG tablet Take 600 mg by mouth at bedtime.      . Lactobacillus (ACIDOPHILUS) CAPS Take 2 capsules by mouth at bedtime.       Marland Kitchen levothyroxine (SYNTHROID, LEVOTHROID) 100 MCG tablet TAKE 1 TABLET BY MOUTH DAILY  30 tablet  2  . LORazepam (ATIVAN) 0.5 MG tablet Take 1 tablet (0.5 mg total) by mouth every 8 (eight) hours.  30 tablet  0  . Melatonin 3 MG TABS Take 3 mg by mouth at bedtime.       . Multiple Vitamin (MULTIVITAMIN) tablet Take 1 tablet by mouth every morning.       Marland Kitchen NEXIUM 40 MG capsule TAKE 1 CAPSULE EVERY MORNING BEFORE BREAKFAST  30 capsule  3  . Nutritional Supplements (DHEA PO) Take 1 tablet by mouth at bedtime.      Marland Kitchen OVER THE COUNTER MEDICATION Take 1 tablet by mouth daily. Percolinate.      . Selenium (SELENIMIN-200) 200 MCG TABS Take 200 mcg by mouth every morning.       . simvastatin (ZOCOR) 10 MG tablet TAKE 1 TABLET BY MOUTH EVERY DAY  30 tablet  3   No current facility-administered medications for this visit.     Problem Relation Age of Onset  . Lung cancer Mother   .  Cancer Mother     Lung  . Prostate cancer Father   . Cancer Father     prostate  . Tuberculosis Paternal Grandfather     patient lived with and was exposed to-wa in the [place] sent to [place]  :  History   Social History  . Marital Status: Widowed  Spouse Name: N/A    Number of Children: 5  . Years of Education: N/A   Occupational History  . retired    Social History Main Topics  . Smoking status: Former Smoker -- 2.00 packs/day for 30 years    Types: Cigarettes    Quit date: 08/09/1981  . Smokeless tobacco: Never Used     Comment: 15-20 years ago (80 pk year history)  . Alcohol Use: .6 - 1.2 oz/week    1-2 Glasses of wine per week  . Drug Use: No  . Sexually Active: Not on file   Other Topics Concern  . Not on file   Social History Narrative   Retired Pensions consultant - now Tree surgeon    Widowed - lives alone     4 living children      Alcohol use-yes (glass of wine per day)    Former Smoker q 15-20 yrs ago (80 pk year history)       :  A comprehensive review of systems was negative.  Exam: Blood pressure 110/70, pulse 77, temperature 97.3 F (36.3 C), temperature source Oral, resp. rate 20, height 5' 6.5" (1.689 m), weight 187 lb 3.2 oz (84.913 kg). General appearance: alert and cooperative Head: Normocephalic, without obvious abnormality, atraumatic Eyes: conjunctivae/corneas clear. PERRL, EOM's intact. Fundi benign. Nose: Nares normal. Septum midline. Mucosa normal. No drainage or sinus tenderness. Throat: lips, mucosa, and tongue normal; teeth and gums normal Neck: no adenopathy, no carotid bruit, no JVD, supple, symmetrical, trachea midline and thyroid not enlarged, symmetric, no tenderness/mass/nodules Resp: clear to auscultation bilaterally Chest wall: no tenderness Cardio: regular rate and rhythm, S1, S2 normal, no murmur, click, rub or gallop GI: soft, non-tender; bowel sounds normal; no masses,  no organomegaly Extremities: extremities normal, atraumatic, no  cyanosis or edema Skin: Skin color, texture, turgor normal. No rashes or lesions Lymph nodes: Cervical, supraclavicular, and axillary nodes normal.   Recent Labs  01/11/13 1203  WBC 12.1*  HGB 9.7*  HCT 29.4*  PLT 205    Recent Labs  01/11/13 1203  NA 138  K 4.1  CL 108*  CO2 19*  GLUCOSE 105*  BUN 61.4*  CREATININE 2.8*  CALCIUM 8.3*      Ir Nephrostomy Tube Change  01/10/2013   *RADIOLOGY REPORT*  IR EXCHANGE OF EXISTING RETROGRADE NEPHROURETERAL STENT UNDER FLUOROSCOPY  Date: 01/10/2013  Clinical History: 79 year old male with a history of bladder and prostate cancer status post cystoprostatectomy and right lower quadrant diverting ileostomy.  He has a chronic indwelling retrograde left nephro ureteral stents and presents for routine exchange.  Procedures Performed: 1. Exchange of left retrograde nephroureteral stent  Interventional Radiologist:  Sterling Big, MD  Fluoroscopy time: 7 minutes 54 seconds  Contrast volume: 10 ml Omnipaque-300  PROCEDURE/FINDINGS:   Informed consent was obtained from the patient following explanation of the procedure, risks, benefits and alternatives. The patient understands, agrees and consents for the procedure. All questions were addressed. A time out was performed.  Maximal barrier sterile technique utilized including caps, mask, sterile gowns, sterile gloves, large sterile drape, hand hygiene, and betadine skin prep.  The colostomy bag was removed.  A gentle hand injection of contrast material confirmed location of the tube within the lower pole infundibulum of the left kidney.  The tube was transected and removed over a wire.  A new 45 cm 12 [place] Cook all-purpose drainage catheter was advanced over the wire and formed within the lower pole infundibulum.  Of note, the  tube was incompletely formed despite multiple attempts to the contrary.  Due to patient discomfort, the tube was left in a partially formed, but locked position.  IMPRESSION:   Successful exchange of 45 cm 12 [place] Cook all-purpose drainage catheter serving as a retrograde nephroureteral stent via right lower quadrant diverting ileostomy.  Of note, there is some difficulty getting the locking loop of the tube to form in the small renal pelvis.  Signed,  Sterling Big, MD Vascular & Interventional Radiologist [facility]   Original Report Authenticated By: Malachy Moan, M.D.    Assessment and Plan:   79 year old gentleman with the following issues:  1. Advanced urothelial cancer with metastatic disease to retroperitoneal lymph nodes, multiple lung nodules and possible bony metastasis. His initial diagnosis of bladder cancer was in 2001 and he underwent radical cystectomy and urinary diversion. The initial pathological staging was a T1 N0. His most recent CT scan in 12/27/2012 showed the above-mentioned findings. I had a lengthy discussion today with the patient and his family discussing the treatment options at this time. I discussed the role of palliative chemotherapy with OD a single agent or or to agents. The risks of these agents were discussed in detail today that includes nausea, vomiting, myelosuppression, weakness, fatigue, tiredness and possible more decline in his performance status and quality of life. At this point, I feel the risks outweigh is the potential benefits and I advised against systemic chemotherapy for him. I recommended proceeding with supportive care only and involving hospice as soon as possible. I'm already seeing signs of decline both physically and cognitively I feel that chemotherapy will interfere with his quality of life and offer little in terms of palliation.  The patient and the family are agreeable and we will make the appropriate hospice referral for the request.  2. Bony metastasis and pain: I discussed with him in details the role of possible palliative radiation therapy if his pain becomes more severe. At this time,  it does not appear to be the case by I counseled him about the size of symptoms of increasing bone pain. If that happens, we'll make referral for radiation oncology for an evaluation. I've also given him prescription for hydrocodone for as-needed basis.  3. Anxiety: I've given him a low-dose lorazepam as needed to help and coping with sleep and anxiety which seems to be an issue for him as of late.  4. Prognosis: I'm afraid that he has poor prognosis with very limited life expectancy giving his age, comorbidities conditions, and the advanced nature of his disease I feel that he is hospice eligible and probably have less than 6 months to live. I shared these findings with his family and they understand his overall poor prognosis.  I have scheduled him for a followup in about 6 weeks to check on his clinical status but he knows he is not able to make that appointment it is reasonable to rely on hospice for updates. All his questions and his family's questions were answered today to their satisfaction.

## 2013-01-11 NOTE — Telephone Encounter (Signed)
gave pt appt for ML on July 2014 no labs

## 2013-01-11 NOTE — Progress Notes (Signed)
Checked in new pt with no financial concerns. °

## 2013-01-11 NOTE — Progress Notes (Signed)
Patient referred to hospice of [facility], per family's request.

## 2013-01-11 NOTE — Addendum Note (Signed)
Addended by: Reesa Chew on: 01/11/2013 02:19 PM   Modules accepted: Orders

## 2013-01-13 ENCOUNTER — Other Ambulatory Visit: Payer: Self-pay | Admitting: Family

## 2013-01-15 ENCOUNTER — Telehealth: Payer: Self-pay | Admitting: Pulmonary Disease

## 2013-01-15 MED ORDER — IPRATROPIUM-ALBUTEROL 20-100 MCG/ACT IN AERS: 1.0000 | INHALATION_SPRAY | Freq: Four times a day (QID) | RESPIRATORY_TRACT | Status: DC | PRN

## 2013-01-15 MED ORDER — BUDESONIDE-FORMOTEROL FUMARATE 160-4.5 MCG/ACT IN AERO: INHALATION_SPRAY | RESPIRATORY_TRACT | Status: AC

## 2013-01-15 NOTE — Telephone Encounter (Signed)
I spoke with Lupita Leash. She stated pt has not had symbicort 160 or combivent refilled in a while and now rx's have expired. Per Lupita Leash pt uses these PRN. Pt is now on hospice. These 2 medications are not on pt medlist. Please advise RA thanks

## 2013-01-15 NOTE — Telephone Encounter (Signed)
Spoke with Lupita Leash.  Informed her of below per RA.  She verbalized understanding.  Lupita Leash states pt does take the the symbicort 160 2 puffs bid and has the combivent to use prn.  Lupita Leash states pt/hospice doesn't want nebs.  They would like rx for symbicort 160 2 puffs bid and combivent prn.  They will need rxs for these medications.  Dr. Vassie Loll, pls advise if you are ok to send both of these rxs for pt.  Thank you.    [facility] [address]

## 2013-01-15 NOTE — Telephone Encounter (Signed)
Ok to stay on symbicort daily if this is helping Otherwise albuterol/ atrovent nebs also ok Hospice can decide

## 2013-01-15 NOTE — Telephone Encounter (Signed)
lmomtcb x1 for donna 

## 2013-01-15 NOTE — Telephone Encounter (Signed)
[name] is aware rx's called in. Nothing further was needed.

## 2013-01-15 NOTE — Telephone Encounter (Signed)
Ok to send

## 2013-01-15 NOTE — Telephone Encounter (Signed)
Patient requesting refill for Symbicort, last filled on 10/27/10. Please advise?

## 2013-01-19 ENCOUNTER — Encounter: Payer: Self-pay | Admitting: *Deleted

## 2013-01-19 NOTE — Progress Notes (Signed)
Faxed signed script and letter of medical necessity to [facility] for help with finances for lift chair.

## 2013-01-22 ENCOUNTER — Telehealth: Payer: Self-pay | Admitting: Family

## 2013-01-22 MED ORDER — SIMVASTATIN 10 MG PO TABS: 10.0000 mg | ORAL_TABLET | Freq: Every day | ORAL | Status: DC

## 2013-01-22 NOTE — Telephone Encounter (Signed)
simvavstatin 10 mg tablet qty 30 take 1 tablet by mouth every day last fill 12-20-2012

## 2013-01-29 ENCOUNTER — Other Ambulatory Visit: Payer: Self-pay | Admitting: Family

## 2013-01-30 NOTE — Telephone Encounter (Signed)
Levothyroxine refill sent to pharmacy. Pt was due for follow up in May. Please call pt to arrange appointment.

## 2013-01-30 NOTE — Telephone Encounter (Signed)
Spoke to patients daughter, Corrie Dandy and she states that patient is now receiving in home Hospice Care. She would like to know if Hospice could take over patients prescriptions now?

## 2013-01-31 NOTE — Telephone Encounter (Signed)
OK with me if hospice takes over his prescriptions.

## 2013-02-02 NOTE — Telephone Encounter (Signed)
Notified pt's daughter, Corrie Dandy. She states pt is under Hospice Care for metastatic bladder cancer. [facility]. No forms/order needed for Hospice per pt's daughter.

## 2013-02-15 ENCOUNTER — Other Ambulatory Visit: Payer: Self-pay | Admitting: Family

## 2013-02-21 ENCOUNTER — Ambulatory Visit (HOSPITAL_COMMUNITY): Payer: Medicare Other

## 2013-02-22 ENCOUNTER — Telehealth: Payer: Self-pay | Admitting: Oncology

## 2013-02-22 ENCOUNTER — Ambulatory Visit (HOSPITAL_BASED_OUTPATIENT_CLINIC_OR_DEPARTMENT_OTHER): Payer: Medicare Other | Admitting: Oncology

## 2013-02-22 ENCOUNTER — Encounter: Payer: Self-pay | Admitting: Oncology

## 2013-02-22 VITALS — BP 110/67 | HR 59 | Temp 96.6°F | Resp 20 | Ht 66.5 in | Wt 175.0 lb

## 2013-02-22 DIAGNOSIS — C7952 Secondary malignant neoplasm of bone marrow: Secondary | ICD-10-CM

## 2013-02-22 DIAGNOSIS — K5900 Constipation, unspecified: Secondary | ICD-10-CM

## 2013-02-22 DIAGNOSIS — R634 Abnormal weight loss: Secondary | ICD-10-CM

## 2013-02-22 DIAGNOSIS — C679 Malignant neoplasm of bladder, unspecified: Secondary | ICD-10-CM

## 2013-02-22 DIAGNOSIS — C772 Secondary and unspecified malignant neoplasm of intra-abdominal lymph nodes: Secondary | ICD-10-CM

## 2013-02-22 DIAGNOSIS — E46 Unspecified protein-calorie malnutrition: Secondary | ICD-10-CM

## 2013-02-22 DIAGNOSIS — C7800 Secondary malignant neoplasm of unspecified lung: Secondary | ICD-10-CM

## 2013-02-22 NOTE — Telephone Encounter (Signed)
gv and printed appt sched and avs forl pt. °

## 2013-02-22 NOTE — Progress Notes (Signed)
Hematology and Oncology Follow Up Visit  [name] [id number] 11-29-1920 79 y.o. 02/22/2013 12:52 PM WesleyMELISSA S., NPO'Sullivan, Melissa, NP   Principle Diagnosis: Advanced urothelial cancer with metastatic disease to the retroperitoneal lymph nodes, multiple lung nodules, and possible bone metastasis. His initial diagnosis of bladder cancer was in 2001 and he underwent radical cystectomy and urinary diversion. The initial pathologic staging was a T1 N0. CT scan from May 2014 showed metastatic disease. He also carries a diagnosis of prostate cancer diagnosed in 2011.  Prior Therapy: The patient underwent a cystectomy and ileal conduit urinary diversion in August 2011. He is status post prostatectomy and radiation for his prostate cancer in 2011.  Current therapy: Watchful observation. He is currently being followed by hospice.  Interim History:  [name] is seen for routine followup today with his daughter. Since his last visit he seems to be doing a little bit better. He is still not driving and does have episodic confusion. He remains independent in his ADLs. He continues to have ongoing hematuria which is unchanged. He reports left lower back pain not associated with any neurological symptoms and uses Vicodin about once a day for this. He reports that he has a good appetite, but he has lost about 12 pounds since his last visit 6 weeks ago. Denies chest pain, shortness of breath, dyspnea on exertion, abdominal pain, nausea, vomiting. He is having some difficulty with constipation and was placed on Senokot by hospice which is been working for him.  Medications: I have reviewed the patient's current medications.  Current Outpatient Prescriptions  Medication Sig Dispense Refill  . calcium-vitamin D (OSCAL WITH D) 500-200 MG-UNIT per tablet Take 2 tablets by mouth daily with breakfast.      . senna (SENOKOT) 8.6 MG tablet Take 1-2 tablets by mouth 2 (two) times daily.      .  temazepam (RESTORIL) 15 MG capsule Take 15 mg by mouth at bedtime as needed for sleep.      . budesonide-formoterol (SYMBICORT) 160-4.5 MCG/ACT inhaler INHALE 2 PUFFS TWICE A DAY  1 Inhaler  2  . ferrous sulfate 325 (65 FE) MG tablet Take 325 mg by mouth 2 (two) times daily.       . furosemide (LASIX) 40 MG tablet TAKE 1 TABLET BY MOUTH EVERY DAY  30 tablet  4  . HYDROcodone-acetaminophen (VICODIN) 2.5-500 MG per tablet Take 1 tablet by mouth every 6 (six) hours as needed for pain.  30 tablet  0  . Ipratropium-Albuterol (COMBIVENT RESPIMAT) 20-100 MCG/ACT AERS respimat Inhale 1 puff into the lungs every 6 (six) hours as needed for wheezing.  1 Inhaler  2  . levothyroxine (SYNTHROID, LEVOTHROID) 100 MCG tablet TAKE 1 TABLET BY MOUTH ONCE DAILY  30 tablet  2  . LORazepam (ATIVAN) 0.5 MG tablet Take 1 tablet (0.5 mg total) by mouth every 8 (eight) hours.  30 tablet  0  . Multiple Vitamin (MULTIVITAMIN) tablet Take 1 tablet by mouth every morning.       Marland Kitchen NEXIUM 40 MG capsule TAKE 1 CAPSULE EVERY MORNING BEFORE BREAKFAST  30 capsule  3   No current facility-administered medications for this visit.     Allergies: No Known Allergies  Past Medical History, Surgical history, Social history, and Family History were reviewed and updated.  Review of Systems: Constitutional:  Negative for fever, chills, night sweats, anorexia, weight loss, pain. Cardiovascular: no chest pain or dyspnea on exertion Respiratory: no cough, shortness of breath, or wheezing  Neurological: no TIA or stroke symptoms Dermatological: negative ENT: negative Skin: Negative. Gastrointestinal: no abdominal pain, change in bowel habits, or black or bloody stools Genito-Urinary: no dysuria, trouble voiding, or hematuria Hematological and Lymphatic: negative Breast: negative for breast lumps Musculoskeletal: negative Remaining ROS negative.  Physical Exam: Blood pressure 110/67, pulse 59, temperature 96.6 F (35.9 C),  temperature source Oral, resp. rate 20, height 5' 6.5" (1.689 m), weight 175 lb (79.379 kg). ECOG: 2-3 General appearance: alert, cooperative and no distress Head: Normocephalic, without obvious abnormality, atraumatic Neck: no adenopathy, no carotid bruit, no JVD, supple, symmetrical, trachea midline and thyroid not enlarged, symmetric, no tenderness/mass/nodules Lymph nodes: Cervical, supraclavicular, and axillary nodes normal. Heart:regular rate and rhythm, S1, S2 normal, no murmur, click, rub or gallop Lung:chest clear, no wheezing, rales, normal symmetric air entry, no tachypnea, retractions or cyanosis Abdomen: soft, non-tender, without masses or organomegaly EXT:no erythema, induration, or nodules   Lab Results: Lab Results  Component Value Date   WBC 12.1* 01/11/2013   HGB 9.7* 01/11/2013   HCT 29.4* 01/11/2013   MCV 86.5 01/11/2013   PLT 205 01/11/2013     Chemistry      Component Value Date/Time   NA 138 01/11/2013 1203   NA 138 10/02/2012 1337   K 4.1 01/11/2013 1203   K 4.6 10/02/2012 1337   CL 108* 01/11/2013 1203   CL 104 10/02/2012 1337   CO2 19* 01/11/2013 1203   CO2 25 10/02/2012 1337   BUN 61.4* 01/11/2013 1203   BUN 46* 10/02/2012 1337   CREATININE 2.8* 01/11/2013 1203   CREATININE 2.10* 10/02/2012 1337   CREATININE 2.45* 07/20/2012 1228      Component Value Date/Time   CALCIUM 8.3* 01/11/2013 1203   CALCIUM 8.5 10/02/2012 1337   ALKPHOS 394* 01/11/2013 1203   ALKPHOS 171* 05/08/2012 1026   AST 32 01/11/2013 1203   AST 14 05/08/2012 1026   ALT 32 01/11/2013 1203   ALT 22 05/08/2012 1026   BILITOT 0.78 01/11/2013 1203   BILITOT 0.7 05/08/2012 1026      Impression and Plan: This is an 79 year old gentleman with the following issues: 1. Advanced urothelial cancer with metastatic disease to the retroperitoneal lymph nodes, multiple lung nodules, and possible bony metastasis. He is currently on supportive care and has hospice involved in the home. Symptoms are controlled at this time.  Recommend continued observation with supportive care. 2. Bony metastasis and pain: Pain is controlled with Vicodin which he is only taking about once a day. If his pain becomes more severe we can consider referral to radiation oncology for evaluation. 3. Constipation: He is using and Senokot which is effective. 4. Insomnia: He uses Restoril at bedtime. 5. Protein calorie malnutrition: He has a good appetite, but is losing weight. I have recommended that he drink 2-3 cans of ensure per day. 6. Followup: He will return in 6 weeks to check his clinical status, but he knows that is not able to make the appointment that we can rely on hospice for updates.  Spent more than half the time coordinating care.    Clenton Pare 7/17/201412:52 PM

## 2013-02-28 ENCOUNTER — Other Ambulatory Visit (HOSPITAL_COMMUNITY): Payer: Medicare Other

## 2013-03-01 ENCOUNTER — Other Ambulatory Visit: Payer: Self-pay | Admitting: Urology

## 2013-03-01 ENCOUNTER — Ambulatory Visit (HOSPITAL_COMMUNITY)
Admission: RE | Admit: 2013-03-01 | Discharge: 2013-03-01 | Disposition: A | Discharge status: Home / Self Care | Source: Ambulatory Visit | Attending: Urology | Admitting: Urology

## 2013-03-01 DIAGNOSIS — Z466 Encounter for fitting and adjustment of urinary device: Secondary | ICD-10-CM | POA: Insufficient documentation

## 2013-03-01 DIAGNOSIS — N1330 Unspecified hydronephrosis: Secondary | ICD-10-CM

## 2013-03-01 MED ORDER — IOHEXOL 300 MG/ML  SOLN
10.0000 mL | Freq: Once | INTRAMUSCULAR | Status: AC | PRN
  Administered 2013-03-01: 10 mL

## 2013-03-12 ENCOUNTER — Other Ambulatory Visit: Payer: Self-pay | Admitting: Family

## 2013-03-12 NOTE — Telephone Encounter (Signed)
Denied--too soon for request--Last Rx to pharmacy 07.10.14, #[id number]

## 2013-03-26 ENCOUNTER — Encounter: Payer: Self-pay | Admitting: Oncology

## 2013-03-26 NOTE — Progress Notes (Signed)
Put fmla form on nurse's desk °

## 2013-03-29 ENCOUNTER — Encounter: Payer: Self-pay | Admitting: Oncology

## 2013-03-29 NOTE — Progress Notes (Signed)
Put daughter's fmla form in registration desk. °

## 2013-04-03 ENCOUNTER — Telehealth: Payer: Self-pay | Admitting: *Deleted

## 2013-04-03 MED ORDER — ESOMEPRAZOLE MAGNESIUM 40 MG PO CPDR: DELAYED_RELEASE_CAPSULE | ORAL | Status: AC

## 2013-04-03 NOTE — Telephone Encounter (Signed)
Received message from Lupita Leash at [facility] requesting refills of pt's Nexium. Wants to know if we can refill medications while under Hospice's care without pt coming in for office visit as she states pt is homebound. [facility] [address]  Please advise.

## 2013-04-03 NOTE — Telephone Encounter (Signed)
rx sent

## 2013-04-03 NOTE — Telephone Encounter (Signed)
Notified Lupita Leash at [facility].

## 2013-04-05 ENCOUNTER — Other Ambulatory Visit: Payer: Self-pay | Admitting: Pulmonary Disease

## 2013-04-10 ENCOUNTER — Ambulatory Visit (HOSPITAL_BASED_OUTPATIENT_CLINIC_OR_DEPARTMENT_OTHER): Payer: Medicare Other | Admitting: Oncology

## 2013-04-10 VITALS — BP 125/57 | HR 69 | Wt 178.4 lb

## 2013-04-10 DIAGNOSIS — C679 Malignant neoplasm of bladder, unspecified: Secondary | ICD-10-CM

## 2013-04-10 DIAGNOSIS — C677 Malignant neoplasm of urachus: Secondary | ICD-10-CM

## 2013-04-10 DIAGNOSIS — R911 Solitary pulmonary nodule: Secondary | ICD-10-CM

## 2013-04-10 DIAGNOSIS — M549 Dorsalgia, unspecified: Secondary | ICD-10-CM

## 2013-04-10 DIAGNOSIS — R319 Hematuria, unspecified: Secondary | ICD-10-CM

## 2013-04-10 DIAGNOSIS — G4700 Insomnia, unspecified: Secondary | ICD-10-CM

## 2013-04-10 DIAGNOSIS — C61 Malignant neoplasm of prostate: Secondary | ICD-10-CM

## 2013-04-10 DIAGNOSIS — C772 Secondary and unspecified malignant neoplasm of intra-abdominal lymph nodes: Secondary | ICD-10-CM

## 2013-04-10 NOTE — Progress Notes (Signed)
Hematology and Oncology Follow Up Visit  [name] [id number] 1920-08-29 79 y.o. 04/10/2013 1:43 PM Wesley S., NPO'Sullivan, Melissa, NP   Principle Diagnosis: Advanced urothelial cancer with metastatic disease to the retroperitoneal lymph nodes, multiple lung nodules, and possible bone metastasis. His initial diagnosis of bladder cancer was in 2001 and he underwent radical cystectomy and urinary diversion. The initial pathologic staging was a T1 N0. CT scan from May 2014 showed metastatic disease. He also carries a diagnosis of prostate cancer diagnosed in 2011.  Prior Therapy: The patient underwent a cystectomy and ileal conduit urinary diversion in August 2011. He is status post prostatectomy and radiation for his prostate cancer in 2011.  Current therapy: Watchful observation. He is currently being followed by hospice.  Interim History:  [name] is seen for routine followup today with his daughter. Since his last visit he seems to be declining more. He is still not driving and does have episodic confusion. He is chair and bed bound now. He continues to have ongoing hematuria which is unchanged. He reports left lower back pain not associated with any neurological symptoms and uses Vicodin about once a day for this. Denies chest pain, shortness of breath, dyspnea on exertion, abdominal pain, nausea, vomiting. He is having some difficulty with constipation and was placed on Senokot by hospice which is been working for him.  Medications: I have reviewed the patient's current medications.  Current Outpatient Prescriptions  Medication Sig Dispense Refill  . budesonide-formoterol (SYMBICORT) 160-4.5 MCG/ACT inhaler INHALE 2 PUFFS TWICE A DAY  1 Inhaler  2  . calcium-vitamin D (OSCAL WITH D) 500-200 MG-UNIT per tablet Take 2 tablets by mouth daily with breakfast.      . COMBIVENT RESPIMAT 20-100 MCG/ACT AERS respimat INHALE 1 PUFF INTO THE LUNGS EVERY 6 HOURS AS NEEDED FOR  WHEEZING  4 g  0  . esomeprazole (NEXIUM) 40 MG capsule TAKE 1 CAPSULE EVERY MORNING BEFORE BREAKFAST  30 capsule  5  . ferrous sulfate 325 (65 FE) MG tablet Take 325 mg by mouth 2 (two) times daily.       . furosemide (LASIX) 40 MG tablet TAKE 1 TABLET BY MOUTH EVERY DAY  30 tablet  4  . HYDROcodone-acetaminophen (VICODIN) 2.5-500 MG per tablet Take 1 tablet by mouth every 6 (six) hours as needed for pain.  30 tablet  0  . levothyroxine (SYNTHROID, LEVOTHROID) 100 MCG tablet TAKE 1 TABLET BY MOUTH ONCE DAILY  30 tablet  2  . LORazepam (ATIVAN) 0.5 MG tablet Take 1 tablet (0.5 mg total) by mouth every 8 (eight) hours.  30 tablet  0  . Multiple Vitamin (MULTIVITAMIN) tablet Take 1 tablet by mouth every morning.       . senna (SENOKOT) 8.6 MG tablet Take 1-2 tablets by mouth 2 (two) times daily.      . temazepam (RESTORIL) 15 MG capsule Take 15 mg by mouth at bedtime as needed for sleep.       No current facility-administered medications for this visit.     Allergies: No Known Allergies  Past Medical History, Surgical history, Social history, and Family History were reviewed and updated.  Review of Systems:  Remaining ROS negative.  Physical Exam: Blood pressure 125/57, pulse 69, weight 178 lb 7 oz (80.939 kg). ECOG: 3 General appearance: alert, cooperative and no distress Head: Normocephalic, without obvious abnormality, atraumatic Neck: no adenopathy, no carotid bruit, no JVD, supple, symmetrical, trachea midline and thyroid not enlarged, symmetric, no tenderness/mass/nodules  Lymph nodes: Cervical, supraclavicular, and axillary nodes normal. Heart:regular rate and rhythm, S1, S2 normal, no murmur, click, rub or gallop Lung:chest clear, no wheezing, rales, normal symmetric air entry, no tachypnea, retractions or cyanosis Abdomen: soft, non-tender, without masses or organomegaly EXT:no erythema, induration, or nodules. 1+ edema.    Lab Results: Lab Results  Component Value Date    WBC 12.1* 01/11/2013   HGB 9.7* 01/11/2013   HCT 29.4* 01/11/2013   MCV 86.5 01/11/2013   PLT 205 01/11/2013     Chemistry      Component Value Date/Time   NA 138 01/11/2013 1203   NA 138 10/02/2012 1337   K 4.1 01/11/2013 1203   K 4.6 10/02/2012 1337   CL 108* 01/11/2013 1203   CL 104 10/02/2012 1337   CO2 19* 01/11/2013 1203   CO2 25 10/02/2012 1337   BUN 61.4* 01/11/2013 1203   BUN 46* 10/02/2012 1337   CREATININE 2.8* 01/11/2013 1203   CREATININE 2.10* 10/02/2012 1337   CREATININE 2.45* 07/20/2012 1228      Component Value Date/Time   CALCIUM 8.3* 01/11/2013 1203   CALCIUM 8.5 10/02/2012 1337   ALKPHOS 394* 01/11/2013 1203   ALKPHOS 171* 05/08/2012 1026   AST 32 01/11/2013 1203   AST 14 05/08/2012 1026   ALT 32 01/11/2013 1203   ALT 22 05/08/2012 1026   BILITOT 0.78 01/11/2013 1203   BILITOT 0.7 05/08/2012 1026      Impression and Plan: This is an 79 year old gentleman with the following issues: 1. Advanced urothelial cancer with metastatic disease to the retroperitoneal lymph nodes, multiple lung nodules, and possible bony metastasis. He is currently on supportive care and has hospice involved in the home. Symptoms are controlled at this time. Recommend continued observation with supportive care. 2. Bony metastasis and pain: Pain is controlled with Vicodin which he is only taking about once a day.  3. Constipation: He is using and Senokot which is effective. 4. Insomnia: He uses Restoril at bedtime. 5. Protein calorie malnutrition: He has a good appetite, but is losing weight. I have recommended that he drink 2-3 cans of ensure per day. 6. Followup: As needed and will relay on hospice for updates.     [name] 9/2/20141:43 PM

## 2013-04-16 ENCOUNTER — Telehealth (HOSPITAL_COMMUNITY): Payer: Self-pay | Admitting: Radiology

## 2013-04-16 NOTE — Telephone Encounter (Signed)
Rec'd call from pt's hospice RN, Lupita Leash [phone]).  Each PCN exchange has become progressively more difficult for the patient.  Pt had chills, cramping, and pain (no fevers) for 2 days post procedure.  Daughter, [name], noticed bloody urine in the bag which has now started to clear.  Pt scheduled to come in for exchange Thurs 04-19-13 at 3:00.  Pt plans to take home meds prior to arrival at hospital for pain/anxiety management (vicodin and ativan).  Wants to try and move exchanges to Q 8 wks, currently at Q 7 wks.  I explained that if the pcn tubing had gotten pulled on, this could cause some bleeding but the urine should clear again quickly.

## 2013-04-17 ENCOUNTER — Other Ambulatory Visit: Payer: Self-pay | Admitting: Radiology

## 2013-04-18 ENCOUNTER — Telehealth (HOSPITAL_COMMUNITY): Payer: Self-pay | Admitting: Radiology

## 2013-04-18 NOTE — Telephone Encounter (Signed)
Called Corrie Dandy to go over plan for pcn exchange tomorrow.  Discussed her concerns of how poorly her father tolerates the procedure and Dr. Elie Goody plan for tomorrow's exchange.  We will give patient a dose of iv atx when he comes for procedure, and patient will take home meds for anxiety.  If he still has issues after the pcn exchange tomorrow, we will plan on sedating the patient for his next exchange.  I asked Corrie Dandy to call into IR on Friday and let us know how the patient was doing.

## 2013-04-19 ENCOUNTER — Ambulatory Visit (HOSPITAL_COMMUNITY)
Admission: RE | Admit: 2013-04-19 | Discharge: 2013-04-19 | Disposition: A | Discharge status: Home / Self Care | Source: Ambulatory Visit | Attending: Urology | Admitting: Urology

## 2013-04-19 DIAGNOSIS — Z436 Encounter for attention to other artificial openings of urinary tract: Secondary | ICD-10-CM | POA: Insufficient documentation

## 2013-04-19 DIAGNOSIS — N1330 Unspecified hydronephrosis: Secondary | ICD-10-CM

## 2013-04-19 DIAGNOSIS — N135 Crossing vessel and stricture of ureter without hydronephrosis: Secondary | ICD-10-CM | POA: Insufficient documentation

## 2013-04-19 MED ORDER — IOHEXOL 300 MG/ML  SOLN
10.0000 mL | Freq: Once | INTRAMUSCULAR | Status: AC | PRN
  Administered 2013-04-19: 10 mL

## 2013-04-19 MED ORDER — CIPROFLOXACIN IN D5W 400 MG/200ML IV SOLN
400.0000 mg | Freq: Once | INTRAVENOUS | Status: AC
  Administered 2013-04-19: 400 mg via INTRAVENOUS
  Filled 2013-04-19 (×2): qty 200

## 2013-04-19 NOTE — Procedures (Signed)
Nephroureteral exchange No comp

## 2013-04-23 ENCOUNTER — Telehealth (HOSPITAL_COMMUNITY): Payer: Self-pay | Admitting: Urology

## 2013-04-23 NOTE — Telephone Encounter (Signed)
Spoke to daughter Corrie Dandy.  Patient did much better this time with the atx.  We have scheduled his next change for 06/07/13, they will call if any problems arise.

## 2013-04-24 ENCOUNTER — Other Ambulatory Visit: Payer: Self-pay | Admitting: Urology

## 2013-04-24 DIAGNOSIS — N1330 Unspecified hydronephrosis: Secondary | ICD-10-CM

## 2013-04-26 ENCOUNTER — Telehealth: Payer: Self-pay | Admitting: *Deleted

## 2013-04-26 DIAGNOSIS — C679 Malignant neoplasm of bladder, unspecified: Secondary | ICD-10-CM

## 2013-04-26 NOTE — Addendum Note (Signed)
Addended by: Wandalee Ferdinand on: 04/26/2013 12:15 PM   Modules accepted: Orders

## 2013-04-26 NOTE — Telephone Encounter (Signed)
Per Dr. Clelia Croft: OK to try 50 mg dose in afternoon and 100 mg at bedtime. Lupita Leash, RN notified. She has nurse down to check on him for the next 2 days since he was doing so poorly recently.

## 2013-04-26 NOTE — Telephone Encounter (Signed)
Hospice nurse called to inquire if MD would be willing to order Seroquel 50 mg dose in late afternoon and then the 100 mg dose at bedtime to help see if this will transition him to being able to go to sleep and stay asleep.

## 2013-04-26 NOTE — Telephone Encounter (Signed)
They have a desperate family due to patient not sleeping at night. In past used temazepam and had "bad reaction". Ambien in past not effective. Trazadone 100 mg helped for awhile-not any longer. Currently taking Vicodin for pain and daughter gives him ativan 0.5 mg #2 tabs at night to sleep and it no longer works. Dr. Mikle Bosworth approved trying Seroquel 50 mg last night and he only slept 1 hour and was up and down all night yelling, talking and trying to take his clothes off. Dr. Mikle Bosworth is not willing to order anything else at this time-wishes Dr. Clelia Croft to have input. Nurse asking if it would be of benefit to increase the Seroquel at night and/or try Haldol (may need strong dose)?

## 2013-04-26 NOTE — Telephone Encounter (Addendum)
Per Dr. Clelia Croft: OK to increase Seroquel to 100 mg at bedtime.

## 2013-04-27 ENCOUNTER — Telehealth: Payer: Self-pay | Admitting: *Deleted

## 2013-04-27 ENCOUNTER — Other Ambulatory Visit: Payer: Self-pay | Admitting: *Deleted

## 2013-04-27 MED ORDER — MORPHINE SULFATE (CONCENTRATE) 20 MG/ML PO SOLN: 10.0000 mg | ORAL | Status: AC | PRN

## 2013-04-27 NOTE — Telephone Encounter (Signed)
Faxed script to [facility] for roxanol, see-MAR, per dr Clelia Croft and [facility] [place].

## 2013-04-27 NOTE — Telephone Encounter (Signed)
HOSPICE PHYSICIAN ORDERED HALDOL LIQUID 4MG  EVERY FOUR HOUR AS NEEDED. PT.'S FIRST DOSE OF THE HALDOL HAS NOT HELPED. WOULD LIKE TO CHANGE HALDOL ORDER TO 4MG  EVERY TWO HOURS UNTIL PT. IS QUIET THEN EVERY FOUR HOURS AROUND THE CLOCK. PT. IS ALSO HAVING PAIN. WOULD LIKE AN ORDER FOR ROXANOL 20MG  /ML SUGGESTED 0.25ML TO 0.5ML EVERY TWO HOURS PRN PAIN OR DYSPNEA. SEND ROXANOL PRESCRIPTION TO [facility] [address] FAX [phone]. THIS NOTE TO DR.SHADAD'S NURSE, DIXIE SMITH,RN.

## 2013-04-30 ENCOUNTER — Telehealth: Payer: Self-pay | Admitting: *Deleted

## 2013-04-30 NOTE — Telephone Encounter (Signed)
THIS NOTE TO DR.SHADAD'S ACTIVE WORK FOLDER.

## 2013-05-09 DEATH — deceased

## 2013-06-07 ENCOUNTER — Other Ambulatory Visit (HOSPITAL_COMMUNITY)
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
## 2014-01-04 IMAGING — XA IR PERC NEPHROSTOMY*L*
1 series · 2 of 2 positions shown · non-contrast
Comparison: Loopogram dated 06/14/2012

CLINICAL DATA: Left hydronephrosis and history of bladder and
prostate cancer with prior cystectomy.  Recent loopogram
demonstrated probable narrowing of the mid ureter.

1.  ULTRASOUND GUIDANCE FOR PUNCTURE OF THE LEFT RENAL COLLECTING
SYSTEM.
2.  LEFT PERCUTANEOUS NEPHROSTOMY TUBE PLACEMENT

[Series 300: tube placements · 2 of 2 slices shown]
[im 1/2]
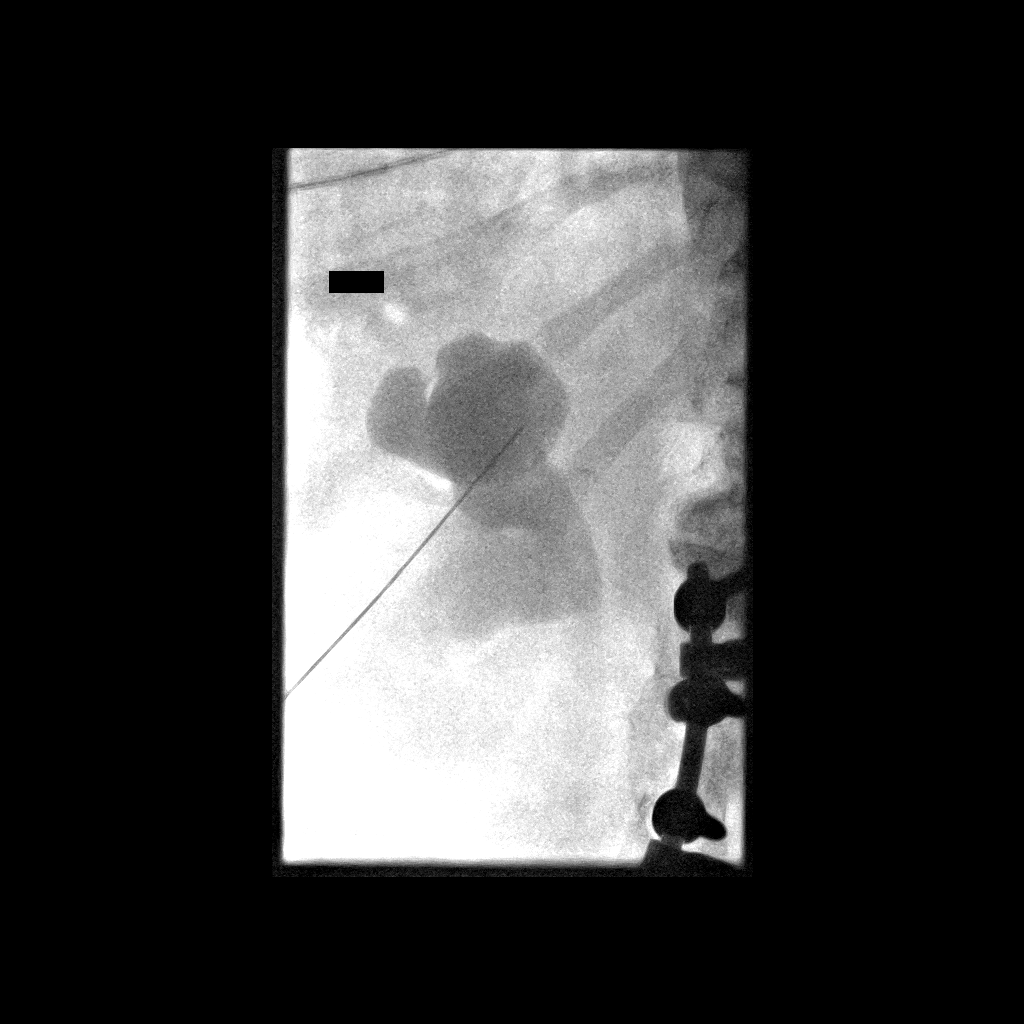
[im 2/2]
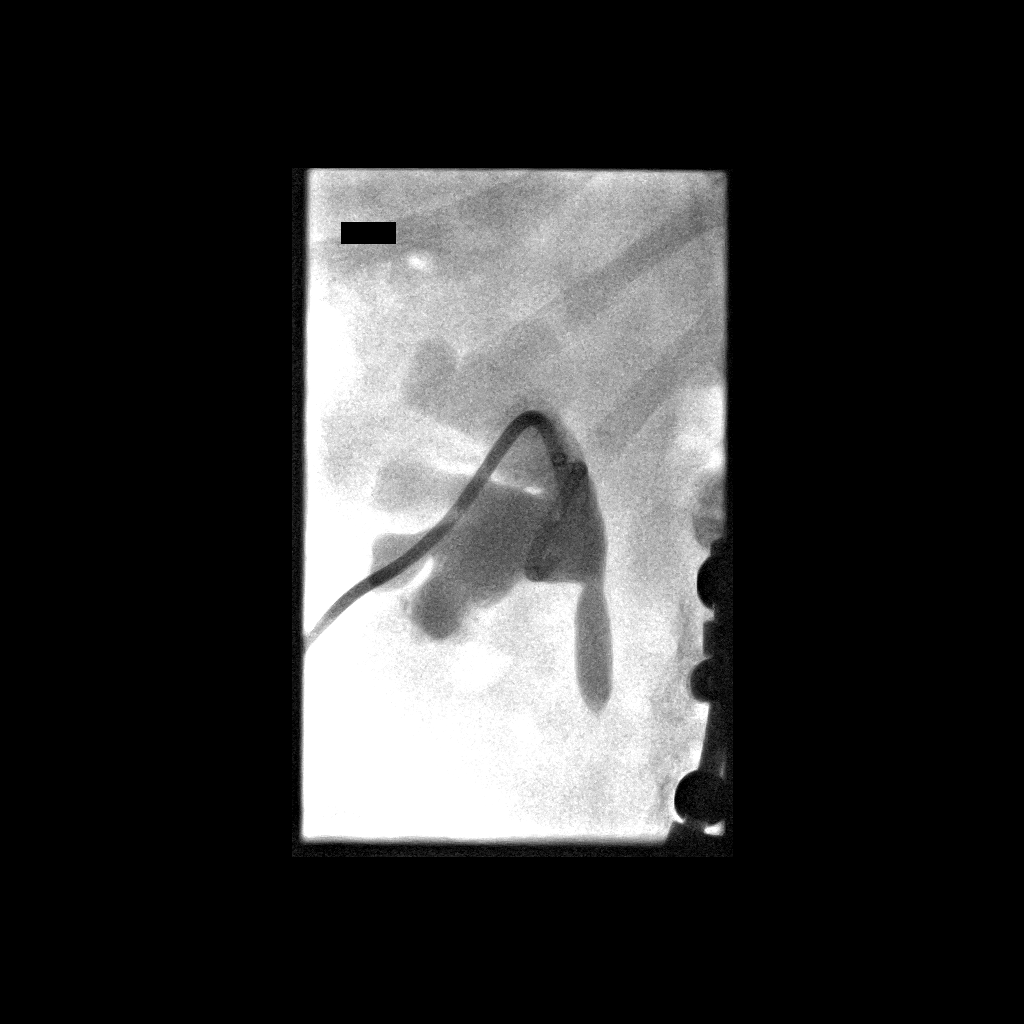

[2 of 2 positions shown; findings below may reference images not displayed]

Sedation: 100 mcg IV Fentanyl.

Total Moderate Sedation Time: 48minutes.

Contrast:  10 ml Omnipaque 300

Additional Medications:  400 mg IV Cipro.  Ciprofloxacin was given
within two hours of incision.

Fluoroscopy Time: 1.4 minutes.

Procedure:  The procedure, risks, benefits, and alternatives were
explained to the patient.  Questions regarding the procedure were
encouraged and answered.  The patient understands and consents to
the procedure.

The left flank region was prepped with Betadine in a sterile
fashion, and a sterile drape was applied covering the operative
field.  A sterile gown and sterile gloves were used for the
procedure. Local anesthesia was provided with 1% Lidocaine.

Ultrasound was used to localize the left kidney.  Under direct
ultrasound guidance, a 21 gauge needle was advanced into the renal
collecting system.  Ultrasound image documentation was performed.
Aspiration of urine sample was performed followed by contrast
injection.

A transitional dilator was advanced over a guidewire.  Percutaneous
tract dilatation was then performed over the guidewire.  A 10-
French percutaneous nephrostomy tube was then advanced and formed
in the collecting system.  Catheter position was confirmed by
fluoroscopy after contrast injection.

The catheter was secured at the skin with a Prolene retention
suture and Stat-Lock device.  A gravity bag was placed.

Complications: None
FINDINGS: Significant hydronephrosis is visualized by ultrasound.
A 10-French nephrostomy tube was placed via upper pole access with
the pigtail portion formed at the level of the renal pelvis.  This
will be left to gravity drainage and the patient brought back next
week for attempted ureteral stent placement.
IMPRESSION: Left-sided percutaneous nephrostomy tube placement.  A 10-French
catheter was placed.  This will be left to gravity drainage and
attempt made next week to place a ureteral stent.
